# Patient Record
Sex: Female | Born: 1960 | Race: White | Hispanic: No | Marital: Single | Smoking: Never smoker
Health system: Southern US, Community
[De-identification: ages and names within clinical notes are randomized; demographics above are authoritative.]

## PROBLEM LIST (undated history)

## (undated) DIAGNOSIS — F32A Depression, unspecified: Secondary | ICD-10-CM

## (undated) DIAGNOSIS — M1990 Unspecified osteoarthritis, unspecified site: Secondary | ICD-10-CM

## (undated) DIAGNOSIS — M8580 Other specified disorders of bone density and structure, unspecified site: Secondary | ICD-10-CM

## (undated) DIAGNOSIS — E559 Vitamin D deficiency, unspecified: Secondary | ICD-10-CM

## (undated) DIAGNOSIS — A64 Unspecified sexually transmitted disease: Secondary | ICD-10-CM

## (undated) DIAGNOSIS — F329 Major depressive disorder, single episode, unspecified: Secondary | ICD-10-CM

## (undated) DIAGNOSIS — Z8742 Personal history of other diseases of the female genital tract: Secondary | ICD-10-CM

## (undated) DIAGNOSIS — E079 Disorder of thyroid, unspecified: Secondary | ICD-10-CM

## (undated) DIAGNOSIS — D219 Benign neoplasm of connective and other soft tissue, unspecified: Secondary | ICD-10-CM

## (undated) DIAGNOSIS — D649 Anemia, unspecified: Secondary | ICD-10-CM

## (undated) HISTORY — DX: Unspecified sexually transmitted disease: A64

## (undated) HISTORY — DX: Depression, unspecified: F32.A

## (undated) HISTORY — DX: Other specified disorders of bone density and structure, unspecified site: M85.80

## (undated) HISTORY — DX: Personal history of other diseases of the female genital tract: Z87.42

## (undated) HISTORY — DX: Major depressive disorder, single episode, unspecified: F32.9

## (undated) HISTORY — PX: BREAST SURGERY: SHX581

## (undated) HISTORY — DX: Disorder of thyroid, unspecified: E07.9

## (undated) HISTORY — DX: Unspecified osteoarthritis, unspecified site: M19.90

## (undated) HISTORY — PX: AUGMENTATION MAMMAPLASTY: SUR837

## (undated) HISTORY — DX: Vitamin D deficiency, unspecified: E55.9

## (undated) HISTORY — DX: Anemia, unspecified: D64.9

## (undated) HISTORY — DX: Benign neoplasm of connective and other soft tissue, unspecified: D21.9

---

## 2013-04-06 ENCOUNTER — Encounter: Payer: Self-pay | Admitting: Internal Medicine

## 2013-04-06 ENCOUNTER — Ambulatory Visit (INDEPENDENT_AMBULATORY_CARE_PROVIDER_SITE_OTHER): Payer: BC Managed Care – PPO | Admitting: Internal Medicine

## 2013-04-06 VITALS — BP 102/68 | HR 64 | Temp 98.3°F | Resp 10 | Ht 64.0 in | Wt 140.0 lb

## 2013-04-06 DIAGNOSIS — E063 Autoimmune thyroiditis: Secondary | ICD-10-CM

## 2013-04-06 DIAGNOSIS — F32A Depression, unspecified: Secondary | ICD-10-CM

## 2013-04-06 DIAGNOSIS — F3289 Other specified depressive episodes: Secondary | ICD-10-CM

## 2013-04-06 DIAGNOSIS — F329 Major depressive disorder, single episode, unspecified: Secondary | ICD-10-CM

## 2013-04-06 DIAGNOSIS — Z8379 Family history of other diseases of the digestive system: Secondary | ICD-10-CM | POA: Insufficient documentation

## 2013-04-06 DIAGNOSIS — L80 Vitiligo: Secondary | ICD-10-CM

## 2013-04-06 LAB — CBC
HCT: 35.9 % — ABNORMAL LOW (ref 36.0–46.0)
RBC: 3.81 Mil/uL — ABNORMAL LOW (ref 3.87–5.11)
RDW: 12.7 % (ref 11.5–14.6)
WBC: 5.1 10*3/uL (ref 4.5–10.5)

## 2013-04-06 MED ORDER — SERTRALINE HCL 50 MG PO TABS: 50.0000 mg | ORAL_TABLET | Freq: Every day | ORAL | Status: DC

## 2013-04-06 MED ORDER — BUPROPION HCL ER (XL) 300 MG PO TB24: 300.0000 mg | ORAL_TABLET | Freq: Every day | ORAL | Status: DC

## 2013-04-06 NOTE — Progress Notes (Signed)
Subjective:     Patient ID: Lori Carney, female   DOB: 1961-07-24, 52 y.o.   MRN: [id number]  HPI Lori Carney is a 52 y/o woman, self-referred for management of Hashimoto hypothyroidism. She had a PCP in [place], but does not have a PCP since she moved to [place].  She was dx with hypothyroidism in 1989, right after giving birth. She was started on thyroid hh. She then developed depression and started medications, now better. She did not have labs checked in 7 years. At the last check, her dose of Synthroid (brand name) was not changed from 175 mcg. In fact, her dose has not been changed for a long time.  She takes Synthroid in am with ice tea usually, but she has not been consistent with taking it, and can take it with meals, or later in the day. She is not taking multivitamins, calcium or iron. She denies symptoms of hypothyroidism, except weight gain (15 pounds in the last 5 years.  She has no FH of thyroid ds. Lori Carney and Lori Carney have celiac ds.; she has vitiligo dx 1970, now extended to large portions of her body.  No other medical problems, has OA. She has depression, which is much better on her current medicines, and she has been on the same doses for 15 years. She also is menopausal for the last 1-1/2 years. She initially had increased anxiety, relieved by HRT, which he took for a year. She stopped taking this, and she did not have her return anxiety episodes. Last visit with OB/GYN was in September of last year.  Review of Systems Constitutional: + weight gain = 15 lbs in last 5 years, no fatigue, has hot flushes Eyes: no blurry vision, + xerophthalmia ENT: no sore throat, no nodules palpated in throat, no dysphagia/odynophagia, no hoarseness Cardiovascular: no CP/SOB/palpitations/leg swelling Respiratory: no cough/SOB Gastrointestinal: no N/V/D/C Musculoskeletal: no muscle/+ joint aches (hands mostly and shoulders)  Skin: + rashes (vitiligo - extended lately) Neurological:  no tremors/numbness/tingling/dizziness Psychiatric: + depression - but much improved with meds - she added Effexor to stop weight gain with Zoloft/no anxiety anymore - due to menopause (was on E+Pg for 1 year).   Past Medical History  Diagnosis Date  . Thyroid disease     hypothyroidism  . Depression    History reviewed. No pertinent past surgical history.  History   Social History  . Marital Status: Single    Number of Children: 42: 42 and 35 year old; has one special needs child   Occupational History  . Not working now, but she would start a new job tomorrow   Social History Main Topics  . Smoking status: Never Smoker   . Smokeless tobacco: Never Used  . Alcohol Use: Yes  . Drug Use: No  . Sexually Active: No   Social History Narrative   Regular exercise: seldom   Caffeine use: tea daily   Meds: buPROPion (WELLBUTRIN XL) 300 MG 24 hr tablet  levothyroxine (SYNTHROID, LEVOTHROID) 175 MCG tablet  sertraline (ZOLOFT) 50 MG tablet  Allergies not on file She was not sure, will let us know through MyChart after she looks in her file at home.  Family History  Problem Relation Age of Onset  . Arthritis Father    Objective:   Physical Exam BP 102/68  Pulse 64  Temp(Src) 98.3 F (36.8 C) (Oral)  Resp 10  Ht 5\' 4"  (1.626 m)  Wt 140 lb (63.504 kg)  BMI 24.02 kg/m2  SpO2 97% Wt Readings  from Last 3 Encounters:  04/06/13 140 lb (63.504 kg)  Constitutional: normal weight, in NAD, appears very tanned, with vitiligo portions of the skin Eyes: PERRLA, EOMI, no exophthalmos ENT: moist mucous membranes, no thyromegaly, no cervical lymphadenopathy Cardiovascular: RRR, No MRG Respiratory: CTA B Gastrointestinal: abdomen soft, NT, ND, BS+ Musculoskeletal: no deformities, strength intact in all 4 Skin: moist, warm, vitiligo and extensive portions of her body Neurological: no tremor with outstretched hands, DTR normal in all 4  Assessment:     1. Hashimoto's Hypothyroidism      Plan:     1. Hashimoto's hypothyroidism We discussed about the fact that taking Synthroid on an empty stomach has more predictable absorption pattern and better stability of her thyroid tests, so I recommended that she start doing this consistently. She was recommended to start calcium and vitamin D per by her OB/GYN, and I advised her to add this at night. - I will check thyroid tests today - when results back I would need to call in Synthroid refills  2. Vitiligo - she has 2 autoimmune diseases (see problem #1 and 2) and we discussed the discounters her a higher risk to develop a third. She has extensive family history of celiac disease, so we'll check a vitamin D today. - I will also check a CMP and CBC, see she has not have labs in a long time  I advised her to join my chart notes in her the results of the labs through there.  I will refer her to internal medicine to establish care with her PCP. Until then, I will refill her depression medications.    Office Visit on 04/06/2013  Component Date Value Range Status  . TSH 04/06/2013 0.10* 0.35 - 5.50 uIU/mL Final  . Free T4 04/06/2013 0.95  0.60 - 1.60 ng/dL Final  . T3, Free 82/95/6213 2.4  2.3 - 4.2 pg/mL Final  . Sodium 04/06/2013 139  135 - 145 mEq/L Final  . Potassium 04/06/2013 4.8  3.5 - 5.1 mEq/L Final  . Chloride 04/06/2013 107  96 - 112 mEq/L Final  . CO2 04/06/2013 26  19 - 32 mEq/L Final  . Glucose, Bld 04/06/2013 92  70 - 99 mg/dL Final  . BUN 08/65/7846 20  6 - 23 mg/dL Final  . Creatinine, Ser 04/06/2013 0.7  0.4 - 1.2 mg/dL Final  . Total Bilirubin 04/06/2013 1.0  0.3 - 1.2 mg/dL Final  . Alkaline Phosphatase 04/06/2013 34* 39 - 117 U/L Final  . AST 04/06/2013 19  0 - 37 U/L Final  . ALT 04/06/2013 18  0 - 35 U/L Final  . Total Protein 04/06/2013 6.9  6.0 - 8.3 g/dL Final  . Albumin 96/29/5284 4.2  3.5 - 5.2 g/dL Final  . Calcium 13/24/4010 9.4  8.4 - 10.5 mg/dL Final  . GFR 27/25/3664 94.35  >60.00 mL/min  Final  . Vit D, 25-Hydroxy 04/06/2013 48  30 - 89 ng/mL Final   Comment: This assay accurately quantifies Vitamin D, which is the sum of the                          25-Hydroxy forms of Vitamin D2 and D3.  Studies have shown that the                          optimum concentration of 25-Hydroxy Vitamin D is 30 ng/mL or higher.  Concentrations of Vitamin D between 20 and 29 ng/mL are considered to                          be insufficient and concentrations less than 20 ng/mL are considered                          to be deficient for Vitamin D.  . WBC 04/06/2013 5.1  4.5 - 10.5 K/uL Final  . RBC 04/06/2013 3.81* 3.87 - 5.11 Mil/uL Final  . Platelets 04/06/2013 264.0  150.0 - 400.0 K/uL Final  . Hemoglobin 04/06/2013 12.2  12.0 - 15.0 g/dL Final  . HCT 78/29/5621 35.9* 36.0 - 46.0 % Final  . MCV 04/06/2013 94.4  78.0 - 100.0 fl Final  . MCHC 04/06/2013 34.0  30.0 - 36.0 g/dL Final  . RDW 30/86/5784 12.7  11.5 - 14.6 % Final   TSH suppressed, will decrease Synthroid to 150 mcg daily - return for labs in 6-8 weeks. Msg sent.

## 2013-04-06 NOTE — Patient Instructions (Signed)
Please return in 6 months. Please join MyChart. I will release the results of your labs through MyChart.

## 2013-04-07 LAB — COMPREHENSIVE METABOLIC PANEL WITH GFR
ALT: 18 U/L (ref 0–35)
AST: 19 U/L (ref 0–37)
Albumin: 4.2 g/dL (ref 3.5–5.2)
Alkaline Phosphatase: 34 U/L — ABNORMAL LOW (ref 39–117)
BUN: 20 mg/dL (ref 6–23)
CO2: 26 meq/L (ref 19–32)
Calcium: 9.4 mg/dL (ref 8.4–10.5)
Chloride: 107 meq/L (ref 96–112)
Creatinine, Ser: 0.7 mg/dL (ref 0.4–1.2)
GFR: 94.35 mL/min
Glucose, Bld: 92 mg/dL (ref 70–99)
Potassium: 4.8 meq/L (ref 3.5–5.1)
Sodium: 139 meq/L (ref 135–145)
Total Bilirubin: 1 mg/dL (ref 0.3–1.2)
Total Protein: 6.9 g/dL (ref 6.0–8.3)

## 2013-04-07 LAB — T4, FREE: Free T4: 0.95 ng/dL (ref 0.60–1.60)

## 2013-04-07 LAB — VITAMIN D 25 HYDROXY (VIT D DEFICIENCY, FRACTURES): Vit D, 25-Hydroxy: 48 ng/mL (ref 30–89)

## 2013-04-08 ENCOUNTER — Encounter: Payer: Self-pay | Admitting: Internal Medicine

## 2013-04-08 MED ORDER — LEVOTHYROXINE SODIUM 150 MCG PO TABS: 150.0000 ug | ORAL_TABLET | Freq: Every day | ORAL | Status: DC

## 2013-10-07 ENCOUNTER — Other Ambulatory Visit: Payer: Self-pay

## 2013-10-13 ENCOUNTER — Encounter: Payer: Self-pay | Admitting: Podiatry

## 2013-10-13 ENCOUNTER — Ambulatory Visit (INDEPENDENT_AMBULATORY_CARE_PROVIDER_SITE_OTHER): Payer: BC Managed Care – PPO | Admitting: Podiatry

## 2013-10-13 VITALS — BP 141/90 | HR 70 | Resp 12

## 2013-10-13 DIAGNOSIS — M722 Plantar fascial fibromatosis: Secondary | ICD-10-CM

## 2013-10-14 NOTE — Progress Notes (Signed)
Subjective:     Patient ID: Lori Carney, female   DOB: [birth year], 52 y.o.   MRN: [id number]  HPI patient left before I could see her   Review of Systems     Objective:   Physical Exam     Assessment:    plantar fasciitis    Plan:     Call the patient to reschedule

## 2013-10-20 ENCOUNTER — Other Ambulatory Visit: Payer: Self-pay | Admitting: Internal Medicine

## 2013-11-05 ENCOUNTER — Emergency Department (HOSPITAL_COMMUNITY)
Admission: EM | Admit: 2013-11-05 | Discharge: 2013-11-05 | Disposition: A | Discharge status: Home / Self Care | Payer: BC Managed Care – PPO | Source: Home / Self Care | Attending: Family Medicine | Admitting: Family Medicine

## 2013-11-05 ENCOUNTER — Encounter (HOSPITAL_COMMUNITY): Payer: Self-pay | Admitting: Emergency Medicine

## 2013-11-05 DIAGNOSIS — N390 Urinary tract infection, site not specified: Secondary | ICD-10-CM

## 2013-11-05 LAB — POCT URINALYSIS DIP (DEVICE)
Bilirubin Urine: NEGATIVE
Ketones, ur: NEGATIVE mg/dL

## 2013-11-05 MED ORDER — CEPHALEXIN 500 MG PO CAPS: 500.0000 mg | ORAL_CAPSULE | Freq: Four times a day (QID) | ORAL | Status: DC

## 2013-11-05 NOTE — ED Provider Notes (Signed)
CSN: [id number]     Arrival date & time 11/05/13  0809 History   First MD Initiated Contact with Patient 11/05/13 0825     Chief Complaint  Patient presents with  . Urinary Tract Infection   (Consider location/radiation/quality/duration/timing/severity/associated sxs/prior Treatment) Patient is a 52 y.o. female presenting with urinary tract infection. The history is provided by the patient.  Urinary Tract Infection This is a new problem. The current episode started more than 1 week ago. The problem has not changed since onset.Pertinent negatives include no chest pain and no abdominal pain.    Past Medical History  Diagnosis Date  . Thyroid disease     hypothyroidism  . Depression    History reviewed. No pertinent past surgical history. Family History  Problem Relation Age of Onset  . Arthritis Father    History  Substance Use Topics  . Smoking status: Never Smoker   . Smokeless tobacco: Never Used  . Alcohol Use: Yes   OB History   Grav Para Term Preterm Abortions TAB SAB Ect Mult Living                 Review of Systems  Constitutional: Negative.  Negative for fever and chills.  Cardiovascular: Negative for chest pain.  Gastrointestinal: Negative.  Negative for abdominal pain.  Genitourinary: Positive for dysuria, urgency and frequency. Negative for hematuria, menstrual problem and pelvic pain.    Allergies  Review of patient's allergies indicates no known allergies.  Home Medications   Current Outpatient Rx  Name  Route  Sig  Dispense  Refill  . buPROPion (WELLBUTRIN XL) 300 MG 24 hr tablet   Oral   Take 1 tablet (300 mg total) by mouth daily.   90 tablet   1   . cephALEXin (KEFLEX) 500 MG capsule   Oral   Take 1 capsule (500 mg total) by mouth 4 (four) times daily. Take all of medicine and drink lots of fluids   20 capsule   0   . levothyroxine (SYNTHROID) 150 MCG tablet   Oral   Take 1 tablet (150 mcg total) by mouth daily before breakfast.   60  tablet   1   . meloxicam (MOBIC) 15 MG tablet               . sertraline (ZOLOFT) 50 MG tablet   Oral   Take 1 tablet (50 mg total) by mouth daily. 1 and 1/2 daily   140 tablet   1    BP 109/58  Pulse 77  Temp(Src) 97.7 F (36.5 C) (Oral)  Resp 16  SpO2 97% Physical Exam  Nursing note and vitals reviewed. Constitutional: She is oriented to person, place, and time. She appears well-developed and well-nourished.  Neck: Normal range of motion. Neck supple.  Abdominal: Soft. Bowel sounds are normal.  Lymphadenopathy:    She has no cervical adenopathy.  Neurological: She is alert and oriented to person, place, and time.  Skin: Skin is warm and dry.    ED Course  Procedures (including critical care time) Labs Review Labs Reviewed  POCT URINALYSIS DIP (DEVICE) - Abnormal; Notable for the following:    Hgb urine dipstick LARGE (*)    Nitrite POSITIVE (*)    Leukocytes, UA SMALL (*)    All other components within normal limits   Imaging Review No results found.  EKG Interpretation    Date/Time:    Ventricular Rate:    PR Interval:    QRS Duration:  QT Interval:    QTC Calculation:   R Axis:     Text Interpretation:              MDM      Linna Hoff, MD 11/05/13 [phone]

## 2013-11-05 NOTE — ED Notes (Signed)
C/O UTI SYMPTOMS FOR A MONTH.  C/O BURNING WITH URINATION, NO ABDOMINAL PAIN, NO BACK PAIN.

## 2013-11-07 ENCOUNTER — Other Ambulatory Visit: Payer: Self-pay | Admitting: Internal Medicine

## 2013-11-08 NOTE — Telephone Encounter (Signed)
Needs to have further refills through PCP!

## 2013-11-17 ENCOUNTER — Emergency Department (HOSPITAL_COMMUNITY)
Admission: EM | Admit: 2013-11-17 | Discharge: 2013-11-17 | Disposition: A | Discharge status: Home / Self Care | Payer: BC Managed Care – PPO | Source: Home / Self Care | Attending: Family Medicine | Admitting: Family Medicine

## 2013-11-17 ENCOUNTER — Encounter (HOSPITAL_COMMUNITY): Payer: Self-pay | Admitting: Emergency Medicine

## 2013-11-17 DIAGNOSIS — J069 Acute upper respiratory infection, unspecified: Secondary | ICD-10-CM

## 2013-11-17 DIAGNOSIS — R319 Hematuria, unspecified: Secondary | ICD-10-CM

## 2013-11-17 LAB — POCT RAPID STREP A: Streptococcus, Group A Screen (Direct): NEGATIVE

## 2013-11-17 LAB — POCT URINALYSIS DIP (DEVICE)
Leukocytes, UA: NEGATIVE
Nitrite: NEGATIVE
Protein, ur: NEGATIVE mg/dL
Urobilinogen, UA: 0.2 mg/dL (ref 0.0–1.0)
pH: 6.5 (ref 5.0–8.0)

## 2013-11-17 LAB — GLUCOSE, CAPILLARY: Glucose-Capillary: 111 mg/dL — ABNORMAL HIGH (ref 70–99)

## 2013-11-17 MED ORDER — ALBUTEROL SULFATE HFA 108 (90 BASE) MCG/ACT IN AERS: 1.0000 | INHALATION_SPRAY | Freq: Four times a day (QID) | RESPIRATORY_TRACT | Status: DC | PRN

## 2013-11-17 MED ORDER — BENZONATATE 100 MG PO CAPS: 100.0000 mg | ORAL_CAPSULE | Freq: Three times a day (TID) | ORAL | Status: DC | PRN

## 2013-11-17 NOTE — ED Notes (Signed)
Follow up on a bladder infection States she does not feel as if infection is clear States she stills urinary frequency and still has a little pain  C/o cold sx for four days now States she has sneezing, wheezing, congestion, cough and sore throat mucinex and Advil was used as treatment but no relief.

## 2013-11-17 NOTE — ED Provider Notes (Signed)
CSN: [id number]     Arrival date & time 11/17/13  0801 History   First MD Initiated Contact with Patient 11/17/13 [phone]     Chief Complaint  Patient presents with  . URI  . Cystitis   (Consider location/radiation/quality/duration/timing/severity/associated sxs/prior Treatment) Patient is a 52 y.o. female presenting with URI. The history is provided by the patient.  URI Presenting symptoms: congestion, cough, fatigue, rhinorrhea and sore throat   Presenting symptoms: no ear pain and no fever   Severity:  Moderate Onset quality:  Gradual Duration:  4 days Progression:  Waxing and waning Chronicity:  New Relieved by:  Nothing Ineffective treatments:  OTC medications Associated symptoms: sneezing   Associated symptoms: no arthralgias, no headaches, no myalgias, no neck pain, no sinus pain, no swollen glands and no wheezing     Past Medical History  Diagnosis Date  . Thyroid disease     hypothyroidism  . Depression    History reviewed. No pertinent past surgical history. Family History  Problem Relation Age of Onset  . Arthritis Father    History  Substance Use Topics  . Smoking status: Never Smoker   . Smokeless tobacco: Never Used  . Alcohol Use: Yes   OB History   Grav Para Term Preterm Abortions TAB SAB Ect Mult Living                 Review of Systems  Constitutional: Positive for fatigue. Negative for fever and chills.  HENT: Positive for congestion, rhinorrhea, sneezing and sore throat. Negative for ear pain.   Eyes: Negative.   Respiratory: Positive for cough. Negative for shortness of breath and wheezing.   Cardiovascular: Negative.   Gastrointestinal: Negative.   Endocrine: Negative for polydipsia and polyphagia.  Genitourinary: Positive for frequency. Negative for dysuria, urgency, hematuria, flank pain, vaginal bleeding, vaginal discharge, enuresis, vaginal pain, menstrual problem and pelvic pain.  Musculoskeletal: Negative for arthralgias, myalgias and  neck pain.  Skin: Negative.   Allergic/Immunologic: Negative for immunocompromised state.  Neurological: Negative for headaches.  Hematological: Negative for adenopathy.  Psychiatric/Behavioral: Negative.     Allergies  Macrobid  Home Medications   Current Outpatient Rx  Name  Route  Sig  Dispense  Refill  . buPROPion (WELLBUTRIN XL) 300 MG 24 hr tablet      TAKE 1 TABLET BY MOUTH DAILY   90 tablet   0   . cephALEXin (KEFLEX) 500 MG capsule   Oral   Take 1 capsule (500 mg total) by mouth 4 (four) times daily. Take all of medicine and drink lots of fluids   20 capsule   0   . levothyroxine (SYNTHROID) 150 MCG tablet   Oral   Take 1 tablet (150 mcg total) by mouth daily before breakfast.   60 tablet   1   . meloxicam (MOBIC) 15 MG tablet               . sertraline (ZOLOFT) 50 MG tablet   Oral   Take 1 tablet (50 mg total) by mouth daily. 1 and 1/2 daily   140 tablet   1    BP 129/57  Pulse 84  Temp(Src) 97.9 F (36.6 C) (Oral)  Resp 16  SpO2 100% Physical Exam  Nursing note and vitals reviewed. Constitutional: She is oriented to person, place, and time. She appears well-developed and well-nourished. No distress.  HENT:  Head: Normocephalic and atraumatic.  Right Ear: Hearing, tympanic membrane, external ear and ear canal normal.  Left Ear: Hearing, tympanic membrane, external ear and ear canal normal.  Nose: Nose normal.  Mouth/Throat: Uvula is midline, oropharynx is clear and moist and mucous membranes are normal.  Eyes: Conjunctivae are normal. Pupils are equal, round, and reactive to light. Right eye exhibits no discharge. Left eye exhibits no discharge. No scleral icterus.  Neck: Normal range of motion. Neck supple.  Cardiovascular: Normal rate, regular rhythm and normal heart sounds.   Pulmonary/Chest: Effort normal and breath sounds normal.  Abdominal: Soft. Bowel sounds are normal. There is no tenderness.  Musculoskeletal: Normal range of motion.   Lymphadenopathy:    She has no cervical adenopathy.  Neurological: She is alert and oriented to person, place, and time.  Skin: Skin is warm and dry. No rash noted.  Psychiatric: She has a normal mood and affect. Her behavior is normal.    ED Course  Procedures (including critical care time) Labs Review Labs Reviewed - No data to display Imaging Review No results found.  EKG Interpretation    Date/Time:    Ventricular Rate:    PR Interval:    QRS Duration:   QT Interval:    QTC Calculation:   R Axis:     Text Interpretation:              MDM  CBG (testing in response to complaint or persistent urinary frequency) near normal at 111. UA with no further signs of infection, but with persistent hematuria, suggesting the need for either repeat testing in 2 weeks with referral to urology if hematuria persists or if patient wishes to follow up with urology after today's visit, will provide referral info to patient on discharge paperwork. (Referred to Dr. Gildardo Griffes Diarmid). Rapid strep negative and exam consistent with viral URI. Will advise symptomatic care at home.     Jess Barters [place], [place] 11/17/13 [phone]

## 2013-11-18 NOTE — ED Provider Notes (Signed)
Medical screening examination/treatment/procedure(s) were performed by a resident physician or non-physician practitioner and as the supervising physician I was immediately available for consultation/collaboration.  Bobbye Reinitz, MD   Bensyn Bornemann S Dawt Reeb, MD 11/18/13 0824 

## 2013-11-19 LAB — CULTURE, GROUP A STREP

## 2013-11-20 ENCOUNTER — Telehealth (HOSPITAL_COMMUNITY): Payer: Self-pay | Admitting: Family Medicine

## 2013-11-20 MED ORDER — AMOXICILLIN 500 MG PO CAPS: 1000.0000 mg | ORAL_CAPSULE | Freq: Two times a day (BID) | ORAL | Status: DC

## 2013-11-20 NOTE — ED Notes (Signed)
Throat culture positive for group A strep.  [facility] called in.  I contacted the patient.   Rodolph Bong, MD 11/20/13 [phone]

## 2013-12-08 ENCOUNTER — Ambulatory Visit (INDEPENDENT_AMBULATORY_CARE_PROVIDER_SITE_OTHER): Payer: BC Managed Care – PPO | Admitting: Obstetrics and Gynecology

## 2013-12-08 ENCOUNTER — Encounter: Payer: Self-pay | Admitting: Obstetrics and Gynecology

## 2013-12-08 VITALS — BP 100/68 | HR 70 | Resp 16 | Ht 64.0 in | Wt 135.5 lb

## 2013-12-08 DIAGNOSIS — Z23 Encounter for immunization: Secondary | ICD-10-CM

## 2013-12-08 DIAGNOSIS — Z0000 Encounter for general adult medical examination without abnormal findings: Secondary | ICD-10-CM

## 2013-12-08 DIAGNOSIS — Z01419 Encounter for gynecological examination (general) (routine) without abnormal findings: Secondary | ICD-10-CM

## 2013-12-08 DIAGNOSIS — R319 Hematuria, unspecified: Secondary | ICD-10-CM

## 2013-12-08 DIAGNOSIS — Z1239 Encounter for other screening for malignant neoplasm of breast: Secondary | ICD-10-CM

## 2013-12-08 LAB — POCT URINALYSIS DIPSTICK
Bilirubin, UA: NEGATIVE
Glucose, UA: NEGATIVE
Ketones, UA: NEGATIVE
Leukocytes, UA: NEGATIVE
NITRITE UA: NEGATIVE
PH UA: 5
Protein, UA: NEGATIVE
Urobilinogen, UA: NEGATIVE

## 2013-12-08 LAB — COMPREHENSIVE METABOLIC PANEL
ALBUMIN: 4.3 g/dL (ref 3.5–5.2)
ALT: 18 U/L (ref 0–35)
AST: 17 U/L (ref 0–37)
Alkaline Phosphatase: 44 U/L (ref 39–117)
BUN: 19 mg/dL (ref 6–23)
CALCIUM: 9.8 mg/dL (ref 8.4–10.5)
CO2: 31 meq/L (ref 19–32)
Chloride: 102 mEq/L (ref 96–112)
Creat: 0.77 mg/dL (ref 0.50–1.10)
Glucose, Bld: 100 mg/dL — ABNORMAL HIGH (ref 70–99)
POTASSIUM: 4.7 meq/L (ref 3.5–5.3)
SODIUM: 138 meq/L (ref 135–145)
TOTAL PROTEIN: 6.8 g/dL (ref 6.0–8.3)
Total Bilirubin: 0.7 mg/dL (ref 0.3–1.2)

## 2013-12-08 LAB — CBC
HCT: 34.6 % — ABNORMAL LOW (ref 36.0–46.0)
Hemoglobin: 11.5 g/dL — ABNORMAL LOW (ref 12.0–15.0)
MCH: 30.4 pg (ref 26.0–34.0)
MCHC: 33.2 g/dL (ref 30.0–36.0)
MCV: 91.5 fL (ref 78.0–100.0)
PLATELETS: 357 10*3/uL (ref 150–400)
RBC: 3.78 MIL/uL — ABNORMAL LOW (ref 3.87–5.11)
RDW: 13.3 % (ref 11.5–15.5)
WBC: 4.8 10*3/uL (ref 4.0–10.5)

## 2013-12-08 LAB — LIPID PANEL
Cholesterol: 223 mg/dL — ABNORMAL HIGH (ref 0–200)
HDL: 104 mg/dL (ref >39)
LDL Cholesterol: 104 mg/dL — ABNORMAL HIGH (ref 0–99)
Total CHOL/HDL Ratio: 2.1 Ratio
Triglycerides: 77 mg/dL (ref <150)
VLDL: 15 mg/dL (ref 0–40)

## 2013-12-08 LAB — HEMOGLOBIN, FINGERSTICK: Hemoglobin, fingerstick: 11.5 g/dL — ABNORMAL LOW (ref 12.0–16.0)

## 2013-12-08 NOTE — Patient Instructions (Addendum)
EXERCISE AND DIET:  We recommended that you start or continue a regular exercise program for good health. Regular exercise means any activity that makes your heart beat faster and makes you sweat.  We recommend exercising at least 30 minutes per day at least 3 days a week, preferably 4 or 5.  We also recommend a diet low in fat and sugar.  Inactivity, poor dietary choices and obesity can cause diabetes, heart attack, stroke, and kidney damage, among others.    ALCOHOL AND SMOKING:  Women should limit their alcohol intake to no more than 7 drinks/beers/glasses of wine (combined, not each!) per week. Moderation of alcohol intake to this level decreases your risk of breast cancer and liver damage. And of course, no recreational drugs are part of a healthy lifestyle.  And absolutely no smoking or even second hand smoke. Most people know smoking can cause heart and lung diseases, but did you know it also contributes to weakening of your bones? Aging of your skin?  Yellowing of your teeth and nails?  CALCIUM AND VITAMIN D:  Adequate intake of calcium and Vitamin D are recommended.  The recommendations for exact amounts of these supplements seem to change often, but generally speaking 600 mg of calcium (either carbonate or citrate) and 800 units of Vitamin D per day seems prudent. Certain women may benefit from higher intake of Vitamin D.  If you are among these women, your doctor will have told you during your visit.    PAP SMEARS:  Pap smears, to check for cervical cancer or precancers,  have traditionally been done yearly, although recent scientific advances have shown that most women can have pap smears less often.  However, every woman still should have a physical exam from her gynecologist every year. It will include a breast check, inspection of the vulva and vagina to check for abnormal growths or skin changes, a visual exam of the cervix, and then an exam to evaluate the size and shape of the uterus and  ovaries.  And after 55 years of age, a rectal exam is indicated to check for rectal cancers. We will also provide age appropriate advice regarding health maintenance, like when you should have certain vaccines, screening for sexually transmitted diseases, bone density testing, colonoscopy, mammograms, etc.   MAMMOGRAMS:  All women over 40 years old should have a yearly mammogram. Many facilities now offer a "3D" mammogram, which may cost around $50 extra out of pocket. If possible,  we recommend you accept the option to have the 3D mammogram performed.  It both reduces the number of women who will be called back for extra views which then turn out to be normal, and it is better than the routine mammogram at detecting truly abnormal areas.    COLONOSCOPY:  Colonoscopy to screen for colon cancer is recommended for all women at age 50.  We know, you hate the idea of the prep.  We agree, BUT, having colon cancer and not knowing it is worse!!  Colon cancer so often starts as a polyp that can be seen and removed at colonscopy, which can quite literally save your life!  And if your first colonoscopy is normal and you have no family history of colon cancer, most women don't have to have it again for 10 years.  Once every ten years, you can do something that may end up saving your life, right?  We will be happy to help you get it scheduled when you are ready.    Be sure to check your insurance coverage so you understand how much it will cost.  It may be covered as a preventative service at no cost, but you should check your particular policy.     Tetanus, Diphtheria, Pertussis (Tdap) Vaccine What You Need to Know WHY GET VACCINATED? Tetanus, diphtheria and pertussis can be very serious diseases, even for adolescents and adults. Tdap vaccine can protect [place] from these diseases. TETANUS (Lockjaw) causes painful muscle tightening and stiffness, usually all over the body.  It can lead to tightening of muscles in the  head and neck so you can't open your mouth, swallow, or sometimes even breathe. Tetanus kills about 1 out of 5 people who are infected. DIPHTHERIA can cause a thick coating to form in the back of the throat.  It can lead to breathing problems, paralysis, heart failure, and death. PERTUSSIS (Whooping Cough) causes severe coughing spells, which can cause difficulty breathing, vomiting and disturbed sleep.  It can also lead to weight loss, incontinence, and rib fractures. Up to 2 in 100 adolescents and 5 in 100 adults with pertussis are hospitalized or have complications, which could include pneumonia and death. These diseases are caused by bacteria. Diphtheria and pertussis are spread from person to person through coughing or sneezing. Tetanus enters the body through cuts, scratches, or wounds. Before vaccines, the [place] saw as many as 200,000 cases a year of diphtheria and pertussis, and hundreds of cases of tetanus. Since vaccination began, tetanus and diphtheria have dropped by about 99% and pertussis by about 80%. TDAP VACCINE Tdap vaccine can protect adolescents and adults from tetanus, diphtheria, and pertussis. One dose of Tdap is routinely given at age 46 or 70. People who did not get Tdap at that age should get it as soon as possible. Tdap is especially important for health care professionals and anyone having close contact with a baby younger than 12 months. Pregnant women should get a dose of Tdap during every pregnancy, to protect the newborn from pertussis. Infants are most at risk for severe, life-threatening complications from pertussis. A similar vaccine, called Td, protects from tetanus and diphtheria, but not pertussis. A Td booster should be given every 10 years. Tdap may be given as one of these boosters if you have not already gotten a dose. Tdap may also be given after a severe cut or burn to prevent tetanus infection. Your doctor can give you more information. Tdap may  safely be given at the same time as other vaccines. SOME PEOPLE SHOULD NOT GET THIS VACCINE  If you ever had a life-threatening allergic reaction after a dose of any tetanus, diphtheria, or pertussis containing vaccine, OR if you have a severe allergy to any part of this vaccine, you should not get Tdap. Tell your doctor if you have any severe allergies.  If you had a coma, or long or multiple seizures within 7 days after a childhood dose of DTP or DTaP, you should not get Tdap, unless a cause other than the vaccine was found. You can still get Td.  Talk to your doctor if you:  have epilepsy or another nervous system problem,  had severe pain or swelling after any vaccine containing diphtheria, tetanus or pertussis,  ever had Guillain-Barr Syndrome (GBS),  aren't feeling well on the day the shot is scheduled. RISKS OF A VACCINE REACTION With any medicine, including vaccines, there is a chance of side effects. These are usually mild and go away on their own, but  serious reactions are also possible. Brief fainting spells can follow a vaccination, leading to injuries from falling. Sitting or lying down for about 15 minutes can help prevent these. Tell your doctor if you feel dizzy or light-headed, or have vision changes or ringing in the ears. Mild problems following Tdap (Did not interfere with activities)  Pain where the shot was given (about 3 in 4 adolescents or 2 in 3 adults)  Redness or swelling where the shot was given (about 1 person in 5)  Mild fever of at least 100.4F (up to about 1 in 25 adolescents or 1 in 100 adults)  Headache (about 3 or 4 people in 10)  Tiredness (about 1 person in 3 or 4)  Nausea, vomiting, diarrhea, stomach ache (up to 1 in 4 adolescents or 1 in 10 adults)  Chills, body aches, sore joints, rash, swollen glands (uncommon) Moderate problems following Tdap (Interfered with activities, but did not require medical attention)  Pain where the shot was  given (about 1 in 5 adolescents or 1 in 100 adults)  Redness or swelling where the shot was given (up to about 1 in 16 adolescents or 1 in 25 adults)  Fever over 102F (about 1 in 100 adolescents or 1 in 250 adults)  Headache (about 3 in 20 adolescents or 1 in 10 adults)  Nausea, vomiting, diarrhea, stomach ache (up to 1 or 3 people in 100)  Swelling of the entire arm where the shot was given (up to about 3 in 100). Severe problems following Tdap (Unable to perform usual activities, required medical attention)  Swelling, severe pain, bleeding and redness in the arm where the shot was given (rare). A severe allergic reaction could occur after any vaccine (estimated less than 1 in a million doses). WHAT IF THERE IS A SERIOUS REACTION? What should I look for?  Look for anything that concerns you, such as signs of a severe allergic reaction, very high fever, or behavior changes. Signs of a severe allergic reaction can include hives, swelling of the face and throat, difficulty breathing, a fast heartbeat, dizziness, and weakness. These would start a few minutes to a few hours after the vaccination. What should I do?  If you think it is a severe allergic reaction or other emergency that can't wait, call 9-1-1 or get the person to the nearest hospital. Otherwise, call your doctor.  Afterward, the reaction should be reported to the "Vaccine Adverse Event Reporting System" (VAERS). Your doctor might file this report, or you can do it yourself through the VAERS web site at www.vaers.hhs.gov, or by calling 1-800-822-7967. VAERS is only for reporting reactions. They do not give medical advice.  THE NATIONAL VACCINE INJURY COMPENSATION PROGRAM The National Vaccine Injury Compensation Program (VICP) is a federal program that was created to compensate people who may have been injured by certain vaccines. Persons who believe they may have been injured by a vaccine can learn about the program and about  filing a claim by calling 1-800-338-2382 or visiting the VICP website at www.hrsa.gov/vaccinecompensation. HOW CAN I LEARN MORE?  Ask your doctor.  Call your local or state health department.  Contact the Centers for Disease Control and Prevention (CDC):  Call 1-800-232-4636 or visit CDC's website at www.cdc.gov/vaccines. CDC Tdap Vaccine VIS (04/09/12) Document Released: 05/19/2012 Document Revised: 03/15/2013 Document Reviewed: 03/10/2013 ExitCare Patient Information 2014 [employer].  

## 2013-12-08 NOTE — Progress Notes (Signed)
Patient ID: Lori Carney, female   DOB: May 20, 1961, 53 y.o.   MRN: [id number]  GYNECOLOGY VISIT  PCP:   Needs a PCP Endocrinologist:  Dr. Cruzita Lederer Referring provider:   HPI: 53 y.o.   Single  Caucasian  female   No obstetric history on file. with Patient's last menstrual period was 12/03/2011.   here for an annual examination.   Mother and paternal grandmother with breast cancer.  Patient has breast implants.    Taking Wellbutrin and Zoloft for the last 25 years.    Has post coital UTIs.  Went to urgent care one month ago.  No burning now.  Has frequency.    Hgb:  11.5 Urine:  1+ - Saw a urologist 55 years ago.  Does not remember.   GYNECOLOGIC HISTORY: Patient's last menstrual period was 12/03/2011. Sexually active:  Not often Partner preference:  Contraception:   none Menopausal hormone therapy: none GYN Procedures:  Status post cryo and LEEP in 2006 or 2007.   Mammogram: December  2012 - BI-RADS 2  Pap:  09/20/2010 - No SIL, BV noted, endometrial cells noted.  Follow up ultrasound - 3 uterine fibroids, one subserosal and 2 intramural, largest 2.9 cm.  EMS 5 mm. Left ovary with 2.7 x 1.6 cm simple cyst.   Right ovary not seen.  No free fluid.  History of abnormal pap smear:  Yes - LEEP in 2006 or 2007.   OB History   Grav Para Term Preterm Abortions TAB SAB Ect Mult Living                   LIFESTYLE: Exercise:   none            Tobacco:  none Alcohol:  5 vodkas a week Drug use:    OTHER HEALTH MAINTENANCE: Tetanus/TDap:  Over 10 years. Gardisil:  NA  Zostavax:  NA  Bone density:   2013 - osteopenia.   T score of spine:  - 1.5, T score of hip: - 0.4 Colonoscopy:  8 years ago in [place] - normal  Cholesterol check:   Family History  Problem Relation Age of Onset  . Arthritis Father     Patient Active Problem List   Diagnosis Date Noted  . Hashimoto's disease 04/06/2013  . Vitiligo 04/06/2013  . FHx: celiac disease 04/06/2013   Past Medical History   Diagnosis Date  . Thyroid disease     hypothyroidism  . Depression     History reviewed. No pertinent past surgical history.  ALLERGIES: Macrobid  Current Outpatient Prescriptions  Medication Sig Dispense Refill  . albuterol (PROVENTIL HFA;VENTOLIN HFA) 108 (90 BASE) MCG/ACT inhaler Inhale 1-2 puffs into the lungs every 6 (six) hours as needed for wheezing or shortness of breath.  1 Inhaler  0  . amoxicillin (AMOXIL) 500 MG capsule Take 2 capsules (1,000 mg total) by mouth 2 (two) times daily.  28 capsule  0  . benzonatate (TESSALON) 100 MG capsule Take 1 capsule (100 mg total) by mouth 3 (three) times daily as needed for cough.  21 capsule  0  . buPROPion (WELLBUTRIN XL) 300 MG 24 hr tablet TAKE 1 TABLET BY MOUTH DAILY  90 tablet  0  . cephALEXin (KEFLEX) 500 MG capsule Take 1 capsule (500 mg total) by mouth 4 (four) times daily. Take all of medicine and drink lots of fluids  20 capsule  0  . levothyroxine (SYNTHROID) 150 MCG tablet Take 1 tablet (150 mcg total) by mouth daily  before breakfast.  60 tablet  1  . meloxicam (MOBIC) 15 MG tablet       . sertraline (ZOLOFT) 50 MG tablet Take 1 tablet (50 mg total) by mouth daily. 1 and 1/2 daily  140 tablet  1   No current facility-administered medications for this visit.     ROS:  Pertinent items are noted in HPI.  SOCIAL HISTORY:  Newly moved from [place].  Works for [employer].   PHYSICAL EXAMINATION:    BP 100/68  Pulse 70  Resp 16  Ht 5\' 4"  (1.626 m)  Wt 135 lb 8 oz (61.462 kg)  BMI 23.25 kg/m2  LMP 12/03/2011   Wt Readings from Last 3 Encounters:  12/08/13 135 lb 8 oz (61.462 kg)  04/06/13 140 lb (63.504 kg)     Ht Readings from Last 3 Encounters:  12/08/13 5\' 4"  (1.626 m)  04/06/13 5\' 4"  (1.626 m)    General appearance: alert, cooperative and appears stated age Head: Normocephalic, without obvious abnormality, atraumatic Neck: no adenopathy, supple, symmetrical, trachea midline and thyroid not enlarged,  symmetric, no tenderness/mass/nodules Lungs: clear to auscultation bilaterally Breasts: Inspection negative, No nipple retraction or dimpling, No nipple discharge or bleeding, No axillary or supraclavicular adenopathy, Normal to palpation without dominant masses Heart: regular rate and rhythm Abdomen: soft, non-tender; no masses,  no organomegaly Extremities: extremities normal, atraumatic, no cyanosis or edema Skin: Skin color, texture, turgor normal. No rashes or lesions.  Scattered area of hypopigmentation. Lymph nodes: Cervical, supraclavicular, and axillary nodes normal. No abnormal inguinal nodes palpated Neurologic: Grossly normal  Pelvic: External genitalia:  no lesions.  Hypopigmentation of the vulva              Urethra:  normal appearing urethra with no masses, tenderness or lesions              Bartholins and Skenes: normal                 Vagina: normal appearing vagina with normal color and discharge, no lesions              Cervix: normal appearance, consistent with LEEP              Pap and high risk HPV testing done: yes.            Bimanual Exam:  Uterus:  uterus is normal size, shape, consistency and nontender                                      Adnexa: normal adnexa in size, nontender and no masses                                      Rectovaginal: Confirms                                      Anus:  normal sphincter tone, no lesions  ASSESSMENT  Normal gynecologic exam. Microscopic hematuria.   Osteopenia. History of LEEP and cryotherapy to cervix Family history of breast cancer Has bilateral breast implants Hypothyroidism Vitiligo   PLAN  Mammogram - 3D at [facility] Center.  Patient will call. Pap smear and high risk HPV testing Counseled on breast self exam,  mammography screening, adequate intake of calcium and vitamin , weight bearing exercise.  CMP, CBC, lipid profile. TDap today. Urine micro and culture. Repeat bone density in 2015 or 2016.  Will  establish care with PCP.  Return annually or prn   An After Visit Summary was printed and given to the patient.

## 2013-12-09 LAB — URINALYSIS, MICROSCOPIC ONLY
BACTERIA UA: NONE SEEN
Casts: NONE SEEN
Crystals: NONE SEEN
Squamous Epithelial / LPF: NONE SEEN

## 2013-12-09 LAB — URINE CULTURE
Colony Count: NO GROWTH
ORGANISM ID, BACTERIA: NO GROWTH

## 2013-12-13 LAB — IPS PAP TEST WITH HPV

## 2013-12-14 NOTE — Addendum Note (Signed)
Addended by: Tacy Learn, Raymona Boss E on: 12/14/2013 12:38 PM   Modules accepted: Orders

## 2013-12-15 LAB — IPS HPV GENOTYPING 16/18

## 2013-12-20 ENCOUNTER — Telehealth: Payer: Self-pay | Admitting: Emergency Medicine

## 2013-12-20 NOTE — Telephone Encounter (Signed)
Message copied by Michele Mcalpine on Mon Dec 20, 2013  4:07 PM ------      Message from: [place], [place]: Mon Dec 13, 2013  9:37 AM       Please inform patient of normal pap but positive high risk HPV test result.      I will proceed with subtyping of HPV for 16 and 18.      If this is positive, patient will need a colposcopy according to protocols.      If it is negative, she can do a repap and repeat HPV testing in one year. ------

## 2013-12-20 NOTE — Telephone Encounter (Signed)
Most recent message from Dr. Quincy Simmonds:   Notes Recorded by Jamey Reas de Berton Lan, MD on 12/16/2013 at 1:19 PM Results to patient through My Chart. Please also contact her by phone to let her know that her test results were negative for HPV types 16 and 18. Place in recall - 08.  Recall placed.  Attempted to reach patient. Message from [place] states that this number is unable to accept incoming calls at this time.

## 2013-12-21 NOTE — Telephone Encounter (Signed)
Unable to complete call per message from [place] [employer].

## 2013-12-23 NOTE — Telephone Encounter (Signed)
Gay Filler has spoke with patient regarding results. Will close encounter.  Notes Recorded by Huey Romans, RN on 12/21/2013 at 5:43 PM Spoke to patient regarding negative pap smear and pos HPV results. Explained with NEGATIVE pap and negative 16/18, which is associated with majority of cervical cancers, recommendation is to repeat pap in one year. 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 Advised that this is generally not needed, is very expensive and she currently has a normal pap. If pap had any abnormality, additional testing might be indicated but this is not the case for her. She reports that she has had two other procedures as treatment of abnormal pap and wonders if hysterectomy would be an option to treat this.Confirmed that although hysterectomy would remove the cervix, this alone is not enough of a medical indication for hysterectomy and there is still potential for recurrence. Advised that repeating pap and HPV in one year allows enough time for there to be improvement and/or worsening of cervical cells without being too much time to allow advanced progression. Advised that she is welcome to come in for consult with MD if has additional questions or still desires additional testing. Declines for now, states she feels she has better understanding. Patient is not scheduled for AEX next year but 08 recall is entered for 12-20-14.

## 2013-12-31 ENCOUNTER — Ambulatory Visit
Admission: RE | Admit: 2013-12-31 | Discharge: 2013-12-31 | Disposition: A | Discharge status: Home / Self Care | Payer: Self-pay | Source: Ambulatory Visit | Attending: Obstetrics and Gynecology | Admitting: Obstetrics and Gynecology

## 2013-12-31 DIAGNOSIS — Z1239 Encounter for other screening for malignant neoplasm of breast: Secondary | ICD-10-CM

## 2014-01-12 ENCOUNTER — Telehealth: Payer: Self-pay | Admitting: Emergency Medicine

## 2014-01-12 NOTE — Telephone Encounter (Signed)
Patient calling with questions regarding Mammogram.  Unable to leave message.  "[employer] customer you are trying to reach is unavailable at this time." Will have to try again.

## 2014-01-14 NOTE — Telephone Encounter (Signed)
Spoke with patient. She has questions regarding the recall she received for her Mammogram from the breast center. Explained that her mammogram was a screening mammogram and the breast center radiologist is requesting additional images on the L breast for a more diagnostic test, so that the radiologist can look directly at the area they would like more images on. Patient will call for follow up. Offered to call to set up, patient declined.   Also, patient is looking for a primary care provider, she was calling to ensure that Dr. Phebe Colla was the provider that Dr Quincy Simmonds suggested. Advised that I did not see a referral placed, patient states she will call office to schedule and find an internal medicine provider.   Routing to provider for final review. Patient agreeable to disposition. Will close encounter

## 2014-01-14 NOTE — Telephone Encounter (Signed)
Lori Carney,  I see the Breast Center has just made a correction in the patient's mammogram and are now calling it normal.  I expect that they will need to contact the patient to make this clarification.  You may take her out of hold.  I did mention Dr. Renato Shin as an option for the patient, but I did not place a referral.

## 2014-01-17 NOTE — Telephone Encounter (Signed)
Patient out of hold.  Patient advised she would contact Dr. Genice Rouge office to schedule.

## 2014-02-06 ENCOUNTER — Other Ambulatory Visit: Payer: Self-pay | Admitting: Internal Medicine

## 2014-02-07 NOTE — Telephone Encounter (Signed)
Will FWD to PCP.

## 2014-02-22 NOTE — Telephone Encounter (Addendum)
I have not yet seen this patient  So cannot rx medication She is no my sched to be est in June . But we can move up appt to see her sooner  April  Not on a tues or Wednesday preferably

## 2014-03-29 ENCOUNTER — Other Ambulatory Visit: Payer: Self-pay | Admitting: Internal Medicine

## 2014-04-05 ENCOUNTER — Other Ambulatory Visit: Payer: Self-pay | Admitting: Obstetrics and Gynecology

## 2014-04-05 MED ORDER — BUPROPION HCL ER (XL) 300 MG PO TB24: ORAL_TABLET | ORAL | Status: DC

## 2014-04-05 NOTE — Telephone Encounter (Signed)
I will approve the Rx for one month.

## 2014-04-05 NOTE — Telephone Encounter (Signed)
Pt is wondering if dr Quincy Simmonds will refill her Rx for Buproprion xl 300 mg for 1 month. Says she can't get in with the Dr she referred her to for 5 months.

## 2014-04-05 NOTE — Telephone Encounter (Signed)
Spoke with patient. Patient states that Dr.Silva referred her to Dr.Panosh but she is not able to be seen until June 5th. Requesting that Dr.Silva refill Rx for Buproprion xl 300 mg for 1 month until she can go for appointment on June 5th. Advised would send a message to Dr.Silva with refill request and would give patient a call back. Patient agreeable.   Dr.Silva, okay to refill Rx for 1 month until patient can be seen by Dr.Panosh? Order pending.

## 2014-04-06 NOTE — Telephone Encounter (Signed)
Spoke with patient. Advised prescription sent to pharmacy of choice for one month. Patient agreeable and verbalizes understanding.  Routing to provider for final review. Patient agreeable to disposition. Will close encounter

## 2014-04-13 NOTE — Progress Notes (Signed)
Will need OV  - last seen 1 year ago. Will discuss then.

## 2014-04-29 ENCOUNTER — Other Ambulatory Visit: Payer: Self-pay | Admitting: Obstetrics and Gynecology

## 2014-04-29 MED ORDER — LEVOTHYROXINE SODIUM 150 MCG PO TABS: 150.0000 ug | ORAL_TABLET | Freq: Every day | ORAL | Status: DC

## 2014-04-29 MED ORDER — BUPROPION HCL ER (XL) 300 MG PO TB24: ORAL_TABLET | ORAL | Status: DC

## 2014-04-29 NOTE — Telephone Encounter (Signed)
05/09/14 patient seeing new PCP. She is requesting refills of generic Wellbutrin XL 300 MG and Synthroid .175 MG to last until that appointment. She had previously requested refills thinking her appointment was 05/06/14.  [place] [phone]

## 2014-04-29 NOTE — Telephone Encounter (Signed)
Pt is requesting additional tablets to last until appt with Dr. Regis Bill on 05/09/14.  Appt date verified in EPIC.   Please advise if refill is OK.

## 2014-05-04 ENCOUNTER — Other Ambulatory Visit: Payer: Self-pay | Admitting: Obstetrics and Gynecology

## 2014-05-05 ENCOUNTER — Telehealth: Payer: Self-pay | Admitting: Obstetrics and Gynecology

## 2014-05-05 MED ORDER — LEVOTHYROXINE SODIUM 175 MCG PO TABS: 175.0000 ug | ORAL_TABLET | Freq: Every day | ORAL | Status: DC

## 2014-05-05 NOTE — Telephone Encounter (Signed)
Spoke with patient. Patient states that she had rx called in by our office yesterday and when she went to pick it up the pills were a different color. Patient states that rx called in was 0.15mg  and she has been taking 0.175mg . Advised patient would placed order for Dr.Silva's review and signature to be sent to pharmacy of choice. Patient agreeable.  Dr.Silva, order placed for Synthroid 181mcg #30 with 7RF until AEX due in January. Please review and advise order. Thank you.

## 2014-05-05 NOTE — Telephone Encounter (Signed)
Patient says her prescription was called in with the wrong dosage yesterday. Please call ASAP.

## 2014-05-05 NOTE — Telephone Encounter (Signed)
Order placed on 5/29 for Synthroid 174mcg until patient could be seen by Dr.Panosh on 05/09/2014. Patient states she is to be taking 12mcg. Spoke with Suezanne Jacquet at [facility] who states that patient last filled rx for synthroid in March with a three month supply for 161mcg. Per Dr.Silva okay to refill rx for one month until patient can be seen by Dr.Panosh. Order placed.  Routing to provider for final review. Patient agreeable to disposition. Will close encounter

## 2014-05-05 NOTE — Telephone Encounter (Signed)
Lori Carney,  We have not been prescribing the patient's Synthroid for her. She did not call in the office yesterday for a refill that I can see. There is no documentation in Epic about this.  Did she call her endocrinologist or her PCP?  Thanks,  Josefa Half  [place]

## 2014-05-05 NOTE — Telephone Encounter (Signed)
Left message to call Kaitlyn at [phone]. 

## 2014-05-05 NOTE — Telephone Encounter (Signed)
Pharmacy calling re: Synthroid. Need to verify strength.

## 2014-05-09 ENCOUNTER — Ambulatory Visit (INDEPENDENT_AMBULATORY_CARE_PROVIDER_SITE_OTHER): Payer: BC Managed Care – PPO | Admitting: Internal Medicine

## 2014-05-09 ENCOUNTER — Encounter: Payer: Self-pay | Admitting: Internal Medicine

## 2014-05-09 VITALS — BP 118/80 | HR 67 | Temp 98.6°F | Ht 64.0 in | Wt 145.5 lb

## 2014-05-09 DIAGNOSIS — J309 Allergic rhinitis, unspecified: Secondary | ICD-10-CM

## 2014-05-09 DIAGNOSIS — E063 Autoimmune thyroiditis: Secondary | ICD-10-CM

## 2014-05-09 DIAGNOSIS — Z8379 Family history of other diseases of the digestive system: Secondary | ICD-10-CM

## 2014-05-09 DIAGNOSIS — M2550 Pain in unspecified joint: Secondary | ICD-10-CM

## 2014-05-09 DIAGNOSIS — L80 Vitiligo: Secondary | ICD-10-CM

## 2014-05-09 DIAGNOSIS — Z299 Encounter for prophylactic measures, unspecified: Secondary | ICD-10-CM

## 2014-05-09 LAB — LIPID PANEL
CHOL/HDL RATIO: 2
Cholesterol: 237 mg/dL — ABNORMAL HIGH (ref 0–200)
HDL: 118 mg/dL (ref >39.00)
LDL CALC: 102 mg/dL — AB (ref 0–99)
NONHDL: 119
TRIGLYCERIDES: 84 mg/dL (ref 0.0–149.0)
VLDL: 16.8 mg/dL (ref 0.0–40.0)

## 2014-05-09 LAB — CBC WITH DIFFERENTIAL/PLATELET
Basophils Absolute: 0 10*3/uL (ref 0.0–0.1)
Basophils Relative: 0.6 % (ref 0.0–3.0)
EOS ABS: 0.3 10*3/uL (ref 0.0–0.7)
Eosinophils Relative: 5.5 % — ABNORMAL HIGH (ref 0.0–5.0)
HCT: 34.3 % — ABNORMAL LOW (ref 36.0–46.0)
Hemoglobin: 11.3 g/dL — ABNORMAL LOW (ref 12.0–15.0)
Lymphocytes Relative: 37.5 % (ref 12.0–46.0)
Lymphs Abs: 2.1 10*3/uL (ref 0.7–4.0)
MCHC: 33 g/dL (ref 30.0–36.0)
MCV: 93.5 fl (ref 78.0–100.0)
MONO ABS: 0.4 10*3/uL (ref 0.1–1.0)
Monocytes Relative: 8 % (ref 3.0–12.0)
NEUTROS PCT: 48.4 % (ref 43.0–77.0)
Neutro Abs: 2.7 10*3/uL (ref 1.4–7.7)
PLATELETS: 276 10*3/uL (ref 150.0–400.0)
RBC: 3.67 Mil/uL — AB (ref 3.87–5.11)
RDW: 14.3 % (ref 11.5–15.5)
WBC: 5.6 10*3/uL (ref 4.0–10.5)

## 2014-05-09 LAB — BASIC METABOLIC PANEL
BUN: 17 mg/dL (ref 6–23)
CALCIUM: 9.8 mg/dL (ref 8.4–10.5)
CO2: 29 meq/L (ref 19–32)
CREATININE: 0.8 mg/dL (ref 0.4–1.2)
Chloride: 103 mEq/L (ref 96–112)
GFR: 82.79 mL/min (ref >60.00)
Glucose, Bld: 94 mg/dL (ref 70–99)
Potassium: 4.2 mEq/L (ref 3.5–5.1)
Sodium: 139 mEq/L (ref 135–145)

## 2014-05-09 LAB — HEPATIC FUNCTION PANEL
ALT: 20 U/L (ref 0–35)
AST: 18 U/L (ref 0–37)
Albumin: 4.1 g/dL (ref 3.5–5.2)
Alkaline Phosphatase: 42 U/L (ref 39–117)
BILIRUBIN DIRECT: 0 mg/dL (ref 0.0–0.3)
BILIRUBIN TOTAL: 0.6 mg/dL (ref 0.2–1.2)
Total Protein: 6.8 g/dL (ref 6.0–8.3)

## 2014-05-09 LAB — TSH: TSH: 0.14 u[IU]/mL — ABNORMAL LOW (ref 0.35–4.50)

## 2014-05-09 NOTE — Progress Notes (Signed)
Chief Complaint  Patient presents with  . Establish Care    Needs to establish with a PCP and needs someone to prescribe her Synthroid.    HPI: Patient comes in today for new patient visit .  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 She will be needing medication management her gynecologist has refilled her medicine in the short run. She had weaned herself off of her antidepressant medications a while back but had some relapsing so went back on and feels normal on these medications is interested in decreasing her medication count but not necessarily at this time. She takes meloxicam as needed for joint pains off and on it helps her when she plays golf. 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 Colonoscopy  08/04/2009  . Mammogram  12/03/2011  . Influenza Vaccine  07/02/2014  . Pap Smear  12/08/2016  . Tetanus/tdap  12/09/2023   Health Maintenance Review   ROS:  GEN/ HEENT: No fever, sweats headaches vision problems hearing changes, CV/ PULM; No chest pain shortness of breath cough, syncope,edema  change in exercise tolerance. GI /GU: No adominal pain, vomiting, change in bowel habits. No blood in the stool. No significant GU symptoms. SKIN/HEME: ,no acute skin rashes suspicious lesions or bleeding. No lymphadenopathy, nodules, masses.  NEURO/ PSYCH:  No neurologic signs such as weakness numbness. No depression anxiety. IMM/  Allergy: No unusual infections.  Allergy .  Has chronic nasal congestion? allergy on no meds . REST of 12 system review negative except as per HPI   Past Medical History  Diagnosis Date  . Thyroid disease     hypothyroidism hashimotos   . Depression     rx at same time as thyroid  on meds for over 20 years had some relpase when tried to go off feels normal on medication  . Osteopenia   . STD (sexually transmitted disease)     HPV  . Arthritis   . Hx of abnormal cervical Pap smear     rx cryo and leep in past    Family History  Problem Relation Age of Onset  . Arthritis Father   . Cancer Father     prostate  . Breast cancer Mother     about age 38  . Breast cancer Paternal Grandmother   . Celiac disease Sister     History   Social History  . Marital Status: Single    Spouse Name: N/A    Number of Children: N/A  . Years of Education: N/A   Social History Main Topics  . Smoking status: Never Smoker   . Smokeless tobacco: Never Used  . Alcohol Use: 2.5 oz/week    5 drink(s) per week     Comment: 5 vodkas a week  . Drug Use: No  . Sexual Activity: No   Other Topics Concern  . None   Social History Narrative   Regular exercise: seldom  Caffeine use: tea daily   Single porig from [place] parents in [place] has lived [place] and  [place]  In [place] for about 2-3 years.    6 hours of sleep per nights   Lives alone/no pets   Single    G2P2 children grown and healthy   BA degrees portfolio manager  [employer]          Outpatient Encounter Prescriptions as of 05/09/2014  Medication Sig  . buPROPion (WELLBUTRIN XL) 300 MG 24 hr tablet TAKE 1 TABLET BY MOUTH DAILY  . levothyroxine (SYNTHROID) 175 MCG tablet Take 1 tablet (175 mcg total) by mouth daily before breakfast.  . meloxicam (MOBIC) 7.5 MG tablet Take 7.5 mg by mouth as needed for pain.  Marland Kitchen sertraline (ZOLOFT) 50 MG tablet Take 1 tablet (50 mg total) by mouth daily. 1 and 1/2 daily  . [DISCONTINUED] albuterol  (PROVENTIL HFA;VENTOLIN HFA) 108 (90 BASE) MCG/ACT inhaler Inhale 1-2 puffs into the lungs every 6 (six) hours as needed for wheezing or shortness of breath.    EXAM:  BP 118/80  Pulse 67  Temp(Src) 98.6 F (37 C) (Oral)  Ht '5\' 4"'  (1.626 m)  Wt 145 lb 8 oz (65.998 kg)  BMI 24.96 kg/m2  LMP 12/03/2011  Body mass index is 24.96 kg/(m^2).  Physical Exam: Vital signs reviewed WSF:KCLE is a well-developed well-nourished alert cooperative    who appearsr stated age in no acute distress.  Congested looks allergic  HEENT: normocephalic atraumatic , Eyes: PERRL EOM's full, conjunctiva clear, Nares: congesterd no deformity discharge or tenderness., Ears: no deformity EAC's clear TMs with normal landmarks. Mouth: clear OP, no lesions, edema.  Moist mucous membranes. Dentition in adequate repair. NECK: supple without masses,or bruits. CHEST/PULM:  Clear to auscultation and percussion breath sounds equal no wheeze , rales or rhonchi. CV: PMI is nondisplaced, S1 S2 no gallops, murmurs, rubs. Peripheral pulses are full without delay.No JVD .  ABDOMEN: Bowel sounds normal nontender  No guard or rebound, no hepato splenomegal no CVA tenderness.  Extremtities:  No clubbing cyanosis or edema, no acute joint swelling or redness no focal atrophy NEURO:  Oriented x3, cranial nerves 3-12 appear to be intact, no obvious focal weakness,gait within normal limits no  SKIN: No acute rashes vitiligo changes  Lips 4 exptermkities and trunk  normal turgor,no bruising or petechiae. PSYCH: Oriented, good eye contact, no obvious depression anxiety, cognition and judgment appear normal. LN: no cervicaladenopathy  ASSESSMENT AND PLAN:  Discussed the following assessment and plan:  Hashimoto's disease - has been on brand  Synthroid for 20+ years onset when young pregnant.  - Plan: Basic metabolic panel, CBC with Differential, Hepatic function panel, Lipid panel, TSH, Celiac panel 10, HLA typing for celiac  disease  Vitiligo - since young age varisous treatments  in a current study  neg fam hx - Plan: Basic metabolic panel, CBC with Differential, Hepatic function panel, Lipid panel, TSH, Celiac panel 10, HLA typing for celiac disease  FHx: celiac disease - check panel and hla as has joint paints and hx of anemia   - Plan: Basic metabolic panel, CBC with Differential, Hepatic function panel, Lipid panel, TSH, Celiac panel 10, HLA typing for celiac disease  Preventive measure - Plan: Basic metabolic panel, CBC with Differential, Hepatic function panel, Lipid panel, TSH, Celiac panel 10, HLA typing for celiac disease  Allergic rhinitis - try otc antihistamine and nasal steroids   Joint pain - takes meloxicam off and on helps pre  GOLF  Mild anemia hx   Patient Care Team: Burnis Medin, MD as PCP - General (Internal Medicine) Patient Instructions  Will notify you  of labs when available. consdier seeing dermatology department at [facility].  Will refill  Medications   If you are doing well can do eyarly preventive visit and labs monitoring  And medication evaluation  Standley Brooking. Panosh M.D.

## 2014-05-09 NOTE — Progress Notes (Signed)
Pre visit review using our clinic review tool, if applicable. No additional management support is needed unless otherwise documented below in the visit note. 

## 2014-05-09 NOTE — Patient Instructions (Signed)
Will notify you  of labs when available. consdier seeing dermatology department at [facility].  Will refill  Medications   If you are doing well can do eyarly preventive visit and labs monitoring  And medication evaluation

## 2014-05-20 ENCOUNTER — Telehealth: Payer: Self-pay | Admitting: Internal Medicine

## 2014-05-20 NOTE — Telephone Encounter (Signed)
Pt would like a cb about her lab results 6/8

## 2014-05-23 ENCOUNTER — Telehealth: Payer: Self-pay | Admitting: Family Medicine

## 2014-05-23 ENCOUNTER — Other Ambulatory Visit: Payer: Self-pay | Admitting: Family Medicine

## 2014-05-23 MED ORDER — LEVOTHYROXINE SODIUM 150 MCG PO TABS: 150.0000 ug | ORAL_TABLET | Freq: Every day | ORAL | Status: DC

## 2014-05-23 NOTE — Telephone Encounter (Signed)
Patient would like to know how much iron she could take to help with her anemia.  Please advise.  Thanks!

## 2014-05-23 NOTE — Telephone Encounter (Signed)
Patient notified of results by telephone. 

## 2014-05-24 NOTE — Telephone Encounter (Signed)
I am not sure this is iron deficiency as we didn't do a specific iron level ..just a  Blood count.  Blood test show would be very unlikely that she has  Celiac.  Would have her get IBC panel and ferritin  And B12 level to  Help define anemia.  Also  Please ask about referral for colonoscopy Which should be done if not already.   (As I cant see that module in the ehr)

## 2014-05-25 NOTE — Telephone Encounter (Signed)
Pt is calling back requesting blood work results °

## 2014-05-27 NOTE — Telephone Encounter (Signed)
Left a message on voicemail.  Will try again at a later time.

## 2014-05-30 ENCOUNTER — Other Ambulatory Visit: Payer: Self-pay | Admitting: Family Medicine

## 2014-05-30 DIAGNOSIS — D649 Anemia, unspecified: Secondary | ICD-10-CM

## 2014-05-30 DIAGNOSIS — E063 Autoimmune thyroiditis: Secondary | ICD-10-CM

## 2014-05-30 NOTE — Telephone Encounter (Signed)
Patient notified of results by telephone.  Orders placed in the system.

## 2014-05-31 ENCOUNTER — Other Ambulatory Visit: Payer: Self-pay | Admitting: Obstetrics and Gynecology

## 2014-05-31 NOTE — Telephone Encounter (Signed)
Last refilled: 04/29/14 #30/0 refills  Last AEX: 12/08/13  AEX Scheduled: no current AEX scheduled.  Please Advise.  (Routed to Dr. Sabra Heck given Dr. Quincy Simmonds is out of office today.)

## 2014-06-01 NOTE — Telephone Encounter (Signed)
Left Message To Call Back  

## 2014-06-01 NOTE — Telephone Encounter (Signed)
Please call pt.  She recently saw Dr. Regis Bill as her new PCP.  She had weaned off her Wellbutrin and Zoloft but then restarted.  Can you clarify?  Does she needs both RXs?  Is she just back on the Wellbutrin.  I'm happy to RF it but would like some additional information.  Rx denied for now.

## 2014-06-02 ENCOUNTER — Other Ambulatory Visit: Payer: Self-pay | Admitting: Obstetrics and Gynecology

## 2014-06-02 NOTE — Telephone Encounter (Signed)
Dr. Regis Bill office is f/u with TSH/Synthroid  05/23/14 #90/1 refill sent in to [facility].

## 2014-06-06 NOTE — Telephone Encounter (Signed)
S/w Patient she gets this rx from Dr. Velora Mediate office this wasn't meant to come to our office.

## 2014-06-14 ENCOUNTER — Telehealth: Payer: Self-pay | Admitting: Internal Medicine

## 2014-06-14 DIAGNOSIS — F32A Depression, unspecified: Secondary | ICD-10-CM

## 2014-06-14 DIAGNOSIS — F329 Major depressive disorder, single episode, unspecified: Secondary | ICD-10-CM

## 2014-06-14 NOTE — Telephone Encounter (Signed)
Will check with WP to make sure she is taking over all medications and to ask how long to fill.

## 2014-06-14 NOTE — Telephone Encounter (Signed)
Ok to refill wellbutrin and zoloft for a year and synthroid for 6 months  She is due for recheck  thyroidin a few months

## 2014-06-14 NOTE — Telephone Encounter (Signed)
Pt req rx on the following meds  buPROPion (WELLBUTRIN XL) 300 MG 24 hr tablet, sertraline (ZOLOFT) 50 MG tablet,  levothyroxine (SYNTHROID, LEVOTHROID) 150 MCG tablet    Pharmacy [facility]  Phone number [phone]

## 2014-06-16 MED ORDER — LEVOTHYROXINE SODIUM 150 MCG PO TABS: 150.0000 ug | ORAL_TABLET | Freq: Every day | ORAL | Status: DC

## 2014-06-16 MED ORDER — SERTRALINE HCL 50 MG PO TABS: 75.0000 mg | ORAL_TABLET | Freq: Every day | ORAL | Status: DC

## 2014-06-16 MED ORDER — BUPROPION HCL ER (XL) 300 MG PO TB24: ORAL_TABLET | ORAL | Status: DC

## 2014-06-16 NOTE — Telephone Encounter (Signed)
Sent to the pharmacy by e-scribe. 

## 2014-08-30 ENCOUNTER — Other Ambulatory Visit: Payer: BC Managed Care – PPO

## 2014-10-03 ENCOUNTER — Encounter: Payer: Self-pay | Admitting: Internal Medicine

## 2014-12-07 ENCOUNTER — Other Ambulatory Visit: Payer: Self-pay

## 2014-12-07 DIAGNOSIS — Z1231 Encounter for screening mammogram for malignant neoplasm of breast: Secondary | ICD-10-CM

## 2014-12-11 ENCOUNTER — Other Ambulatory Visit: Payer: Self-pay | Admitting: Internal Medicine

## 2015-01-02 ENCOUNTER — Ambulatory Visit
Admission: RE | Admit: 2015-01-02 | Discharge: 2015-01-02 | Disposition: A | Discharge status: Home / Self Care | Payer: BLUE CROSS/BLUE SHIELD | Source: Ambulatory Visit

## 2015-01-02 DIAGNOSIS — Z1231 Encounter for screening mammogram for malignant neoplasm of breast: Secondary | ICD-10-CM

## 2015-02-14 ENCOUNTER — Telehealth: Payer: Self-pay | Admitting: *Deleted

## 2015-02-14 NOTE — Telephone Encounter (Signed)
08 Pap recall due 12/2014 due to previous LEEP and pos HR HPV 2015  Past History:   12/08/13, negative with pos HR HPV, neg 16/18 Pt reports LEEP in 2006 or 2007.   New to practice in 12/2013.  Pt has not scheduled AEX with Dr. Quincy Simmonds.  Please call pt to schedule AEX.  Thank you.

## 2015-02-14 NOTE — Telephone Encounter (Signed)
Recall extended to 04/01/15. Routing to provider for final review.  Closing encounter.

## 2015-02-14 NOTE — Telephone Encounter (Signed)
Called patient and scheduled her for AEX 03/16/15 at 8:00 with Dr. Quincy Simmonds.

## 2015-03-16 ENCOUNTER — Encounter: Payer: Self-pay | Admitting: Obstetrics and Gynecology

## 2015-03-16 ENCOUNTER — Ambulatory Visit (INDEPENDENT_AMBULATORY_CARE_PROVIDER_SITE_OTHER): Payer: BLUE CROSS/BLUE SHIELD | Admitting: Obstetrics and Gynecology

## 2015-03-16 VITALS — BP 130/74 | HR 70 | Resp 20 | Ht 64.0 in | Wt 148.6 lb

## 2015-03-16 DIAGNOSIS — D259 Leiomyoma of uterus, unspecified: Secondary | ICD-10-CM

## 2015-03-16 DIAGNOSIS — M8580 Other specified disorders of bone density and structure, unspecified site: Secondary | ICD-10-CM

## 2015-03-16 DIAGNOSIS — Z01419 Encounter for gynecological examination (general) (routine) without abnormal findings: Secondary | ICD-10-CM

## 2015-03-16 DIAGNOSIS — R319 Hematuria, unspecified: Secondary | ICD-10-CM

## 2015-03-16 DIAGNOSIS — Z862 Personal history of diseases of the blood and blood-forming organs and certain disorders involving the immune mechanism: Secondary | ICD-10-CM | POA: Diagnosis not present

## 2015-03-16 DIAGNOSIS — Z0000 Encounter for general adult medical examination without abnormal findings: Secondary | ICD-10-CM

## 2015-03-16 LAB — POCT URINALYSIS DIPSTICK
Bilirubin, UA: NEGATIVE
Glucose, UA: NEGATIVE
KETONES UA: NEGATIVE
LEUKOCYTES UA: NEGATIVE
NITRITE UA: NEGATIVE
PROTEIN UA: NEGATIVE
Urobilinogen, UA: NEGATIVE
pH, UA: 5

## 2015-03-16 LAB — CBC
HEMATOCRIT: 37.7 % (ref 36.0–46.0)
Hemoglobin: 12.3 g/dL (ref 12.0–15.0)
MCH: 31 pg (ref 26.0–34.0)
MCHC: 32.6 g/dL (ref 30.0–36.0)
MCV: 95 fL (ref 78.0–100.0)
MPV: 9.7 fL (ref 8.6–12.4)
Platelets: 312 10*3/uL (ref 150–400)
RBC: 3.97 MIL/uL (ref 3.87–5.11)
RDW: 13.3 % (ref 11.5–15.5)
WBC: 5.2 10*3/uL (ref 4.0–10.5)

## 2015-03-16 NOTE — Addendum Note (Signed)
Addended by: Lowella Fairy on: 03/16/2015 09:13 AM   Modules accepted: Orders

## 2015-03-16 NOTE — Progress Notes (Signed)
Patient ID: Lori Carney, female   DOB: 11-29-1961, 54 y.o.   MRN: [id number] 54 y.o. [id number] SingleCaucasianF here for annual exam.    Mother and paternal grandmother with breast cancer.  Patient has breast implants.   Taking Wellbutrin and Zoloft for the last 25 years.   Sees endocrinology for thyroid management.  Sees Dr. Elyse Hsu.  PCP:  Rachell Cipro, MD - Just had first appointment.   Patient's last menstrual period was 12/03/2011.          Sexually active: No. female The current method of family planning is post menopausal status.    Exercising: No.  none. Smoker:  no  Health Maintenance: Pap:  12-08-13 wnl:Pos HR HPV--neg 16/18 History of abnormal Pap:  Yes, pap wnl/Pos HR HPV 12-08-13, Hx LEEP in 2006 or 2007 MMG:  01-02-15 dense/nl:[facility] Colonoscopy:  2007 normal in [place] BMD:   n/a TDaP:  12/2013 Screening Labs:  Urine today:  Large RBCs.   reports that she has never smoked. She has never used smokeless tobacco. She reports that she drinks about 8.4 oz of alcohol per week. She reports that she does not use illicit drugs.  Past Medical History  Diagnosis Date  . Thyroid disease     hypothyroidism hashimotos   . Depression     rx at same time as thyroid  on meds for over 20 years had some relpase when tried to go off feels normal on medication  . Osteopenia   . STD (sexually transmitted disease)     HPV  . Arthritis   . Hx of abnormal cervical Pap smear     rx cryo and leep in past    Past Surgical History  Procedure Laterality Date  . Breast surgery      cysts removed from Lt and Rt. breasts-benign    Current Outpatient Prescriptions  Medication Sig Dispense Refill  . buPROPion (WELLBUTRIN XL) 150 MG 24 hr tablet Take 1 tablet by mouth daily.  0  . sertraline (ZOLOFT) 50 MG tablet Take 1.5 tablets (75 mg total) by mouth daily. 45 tablet 11  . SYNTHROID 75 MCG tablet Take 1 tablet by mouth daily.  11   No current facility-administered  medications for this visit.    Family History  Problem Relation Age of Onset  . Arthritis Father   . Cancer Father     prostate  . Breast cancer Mother     about age 29  . Breast cancer Paternal Grandmother   . Celiac disease Sister     ROS:  Pertinent items are noted in HPI.  Otherwise, a comprehensive ROS was negative.  Exam:   BP 130/74 mmHg  Pulse 70  Resp 20  Ht 5\' 4"  (1.626 m)  Wt 148 lb 9.6 oz (67.405 kg)  BMI 25.49 kg/m2  LMP 12/03/2011      Height: 5\' 4"  (162.6 cm)  Ht Readings from Last 3 Encounters:  03/16/15 5\' 4"  (1.626 m)  05/09/14 5\' 4"  (1.626 m)  12/08/13 5\' 4"  (1.626 m)    General appearance: alert, cooperative and appears stated age Head: Normocephalic, without obvious abnormality, atraumatic Neck: no adenopathy, supple, symmetrical, trachea midline and thyroid normal to inspection and palpation Lungs: clear to auscultation bilaterally Breasts: normal appearance, no masses or tenderness, No nipple retraction or dimpling, No nipple discharge or bleeding, No axillary or supraclavicular adenopathy, bilateral implants. Heart: regular rate and rhythm Abdomen: soft, non-tender; bowel sounds normal; no masses,  no organomegaly Extremities:  extremities normal, atraumatic, no cyanosis or edema Skin: Texture, turgor normal. No rashes or lesions.  Hypopigmentation areas. Lymph nodes: Cervical, supraclavicular, and axillary nodes normal. No abnormal inguinal nodes palpated Neurologic: Grossly normal   Pelvic: External genitalia:  no lesions              Urethra:  normal appearing urethra with no masses, tenderness or lesions              Bartholins and Skenes: normal                 Vagina: normal appearing vagina with normal color and discharge, no lesions              Cervix: consistent iwht LEEP.              Pap taken: Yes.   Bimanual Exam:  Uterus:  Irregular texture of anterior uterus - fibroids?              Adnexa: normal adnexa                Rectovaginal: Confirms               Anus:  normal sphincter tone, no lesions  Chaperone was present for exam.  A:  Well Woman with normal exam Normal gynecologic exam. Microscopic hematuria.  Possible uterine fibroids. Osteopenia. History of LEEP and cryotherapy to cervix Family history of breast cancer Has bilateral breast implants Hypothyroidism Vitiligo   P:   Mammogram yearly. pap smear and HR HPV testing.  Return for pelvic ultrasound.  Urine micro and culture.  May need to return to urology. Will do bone density.  Patient will call to schedule appointment.  Order placed.  Routine labs. return annually or prn

## 2015-03-16 NOTE — Patient Instructions (Signed)

## 2015-03-17 ENCOUNTER — Telehealth: Payer: Self-pay | Admitting: Obstetrics and Gynecology

## 2015-03-17 LAB — COMPREHENSIVE METABOLIC PANEL
ALT: 18 U/L (ref 0–35)
AST: 19 U/L (ref 0–37)
Albumin: 4.3 g/dL (ref 3.5–5.2)
Alkaline Phosphatase: 38 U/L — ABNORMAL LOW (ref 39–117)
BUN: 16 mg/dL (ref 6–23)
CO2: 28 mEq/L (ref 19–32)
Calcium: 9.6 mg/dL (ref 8.4–10.5)
Chloride: 105 mEq/L (ref 96–112)
Creat: 0.74 mg/dL (ref 0.50–1.10)
GLUCOSE: 62 mg/dL — AB (ref 70–99)
Potassium: 4.9 mEq/L (ref 3.5–5.3)
SODIUM: 141 meq/L (ref 135–145)
TOTAL PROTEIN: 6.6 g/dL (ref 6.0–8.3)
Total Bilirubin: 0.4 mg/dL (ref 0.2–1.2)

## 2015-03-17 LAB — URINALYSIS, MICROSCOPIC ONLY
Bacteria, UA: NONE SEEN
CASTS: NONE SEEN
Crystals: NONE SEEN
SQUAMOUS EPITHELIAL / LPF: NONE SEEN

## 2015-03-17 LAB — LIPID PANEL
CHOL/HDL RATIO: 2.2 ratio
CHOLESTEROL: 261 mg/dL — AB (ref 0–200)
HDL: 120 mg/dL (ref >=46)
LDL Cholesterol: 118 mg/dL — ABNORMAL HIGH (ref 0–99)
TRIGLYCERIDES: 116 mg/dL (ref <150)
VLDL: 23 mg/dL (ref 0–40)

## 2015-03-17 LAB — IRON: Iron: 102 ug/dL (ref 42–145)

## 2015-03-17 LAB — URINE CULTURE
Colony Count: NO GROWTH
Organism ID, Bacteria: NO GROWTH

## 2015-03-17 NOTE — Telephone Encounter (Signed)
Patient called with the date of 03/23/15 [facility]. She says she was told to call with this information.

## 2015-03-17 NOTE — Telephone Encounter (Signed)
Call to patient. Advised of benefit quote received for PUS. °Patient agreeable. Scheduled PUS. °Advised patient of 72 hour cancellation policy and $100 cancellation fee. Patient agreeable. °

## 2015-03-17 NOTE — Telephone Encounter (Signed)
Routing to Dr.Silva as FYI.  Routing to provider for final review. Patient agreeable to disposition. Will close encounter

## 2015-03-20 LAB — IPS PAP TEST WITH HPV

## 2015-03-23 ENCOUNTER — Ambulatory Visit
Admission: RE | Admit: 2015-03-23 | Discharge: 2015-03-23 | Disposition: A | Discharge status: Home / Self Care | Payer: BLUE CROSS/BLUE SHIELD | Source: Ambulatory Visit | Attending: Obstetrics and Gynecology | Admitting: Obstetrics and Gynecology

## 2015-03-23 DIAGNOSIS — M8580 Other specified disorders of bone density and structure, unspecified site: Secondary | ICD-10-CM

## 2015-03-30 ENCOUNTER — Ambulatory Visit (INDEPENDENT_AMBULATORY_CARE_PROVIDER_SITE_OTHER): Payer: BLUE CROSS/BLUE SHIELD | Admitting: Obstetrics and Gynecology

## 2015-03-30 ENCOUNTER — Encounter: Payer: Self-pay | Admitting: Obstetrics and Gynecology

## 2015-03-30 ENCOUNTER — Ambulatory Visit (INDEPENDENT_AMBULATORY_CARE_PROVIDER_SITE_OTHER): Payer: BLUE CROSS/BLUE SHIELD

## 2015-03-30 VITALS — BP 110/76 | Resp 14 | Ht 64.0 in | Wt 144.0 lb

## 2015-03-30 DIAGNOSIS — D259 Leiomyoma of uterus, unspecified: Secondary | ICD-10-CM | POA: Diagnosis not present

## 2015-03-30 NOTE — Progress Notes (Signed)
Subjective  54 y.o. [id number]  @Caucasian  female here for pelvic ultrasound for evaluation of possible fibroids noted on routine pelvic exam. Postmenopausal, not on HRT.  Labs also reviewed from annual exam visit.   Objective  Pelvic ultrasound images and report reviewed with patient.  Uterus - 2 fibroids - 19 mm posterior fundal intramural and 12 mm anterior subserosal. EMS - 2.24 mm Ovaries - normal Free fluid - no     Assessment  Small uterine fibroids.  Asymptomatic. Normal recent labs at annual exam visit.   Plan  Discussion of fibroids etiology, symptoms, general benign nature. ACOG handout to patient on fibroids.  Will do observational management of fibroids and check with pelvic exam annually.   __15_____ minutes face to face time of which over 50% was spent in counseling.   After visit summary to patient.

## 2015-07-04 ENCOUNTER — Other Ambulatory Visit: Payer: Self-pay | Admitting: Family Medicine

## 2015-07-04 ENCOUNTER — Other Ambulatory Visit: Payer: Self-pay | Admitting: Internal Medicine

## 2015-07-04 ENCOUNTER — Telehealth: Payer: Self-pay | Admitting: Family Medicine

## 2015-07-04 DIAGNOSIS — Z0000 Encounter for general adult medical examination without abnormal findings: Secondary | ICD-10-CM

## 2015-07-04 NOTE — Telephone Encounter (Signed)
Pt is now due for cpx.  I have placed the lab orders.  Please help her to make both appointments.  Thanks!

## 2015-07-04 NOTE — Telephone Encounter (Signed)
Sent in for 30 days.  Pt is now due for her CPX.  Message sent to scheduling.

## 2015-07-05 NOTE — Telephone Encounter (Signed)
lmom for pt to call back

## 2015-07-07 NOTE — Telephone Encounter (Signed)
lmom for pt to call back

## 2015-07-13 NOTE — Telephone Encounter (Signed)
lmom for pt to call back

## 2015-08-02 ENCOUNTER — Other Ambulatory Visit: Payer: Self-pay | Admitting: Internal Medicine

## 2015-08-02 NOTE — Telephone Encounter (Signed)
Sent to the pharmacy by e-scribe for 15 tabs.  Pt needs appointment.  The front desk has tried to reach her by telephone for a cpx appt.  See telephone note.

## 2015-08-09 ENCOUNTER — Other Ambulatory Visit: Payer: Self-pay | Admitting: Internal Medicine

## 2015-08-10 NOTE — Telephone Encounter (Signed)
Denied.  Pt is past due for cpx.  Attempts have been made to reach her.  Last refill was for 15 pills.

## 2015-12-21 ENCOUNTER — Other Ambulatory Visit: Payer: Self-pay

## 2015-12-21 DIAGNOSIS — Z1231 Encounter for screening mammogram for malignant neoplasm of breast: Secondary | ICD-10-CM

## 2016-02-19 ENCOUNTER — Ambulatory Visit
Admission: RE | Admit: 2016-02-19 | Discharge: 2016-02-19 | Disposition: A | Discharge status: Home / Self Care | Payer: No Typology Code available for payment source | Source: Ambulatory Visit

## 2016-02-19 DIAGNOSIS — Z1231 Encounter for screening mammogram for malignant neoplasm of breast: Secondary | ICD-10-CM

## 2016-04-03 ENCOUNTER — Ambulatory Visit (INDEPENDENT_AMBULATORY_CARE_PROVIDER_SITE_OTHER): Payer: PRIVATE HEALTH INSURANCE | Admitting: Obstetrics and Gynecology

## 2016-04-03 ENCOUNTER — Encounter: Payer: Self-pay | Admitting: Obstetrics and Gynecology

## 2016-04-03 VITALS — BP 104/64 | HR 64 | Resp 20 | Ht 63.5 in | Wt 140.0 lb

## 2016-04-03 DIAGNOSIS — Z113 Encounter for screening for infections with a predominantly sexual mode of transmission: Secondary | ICD-10-CM | POA: Diagnosis not present

## 2016-04-03 DIAGNOSIS — Z01419 Encounter for gynecological examination (general) (routine) without abnormal findings: Secondary | ICD-10-CM

## 2016-04-03 NOTE — Progress Notes (Signed)
Patient ID: Lori Carney, female   DOB: August 28, 1961, 55 y.o.   MRN: [id number] 55 y.o. [id number] Single Caucasian female here for annual exam.    Ultrasound 2016 - 2 fibroids - 19 mm posterior fundal intramural and 12 mm anterior subserosal.  Taking iron every other day for anemia.   Taking calcium and vit D also.   Daughter marring in [place] this summer.  Patient is from [place] originally.   PCP:   Rachell Cipro, MD Endocrinology:  Lorne Skeens, MD  Patient's last menstrual period was 12/03/2011.           Sexually active: No. female The current method of family planning is post menopausal status.    Exercising: No.   Smoker:  no  Health Maintenance: Pap:  03-16-15 Neg:Neg HR HPV History of abnormal Pap:  Yes, pap wnl/Pos HR HPV 12-08-13, Subtypes 16/18 negative each, Hx LEEP in 2006 or 2007. MMG:  02-29-16 Density C/Neg/BiRads1:[facility] Colonoscopy:  2007 normal in [place] per patient;next due this year.   BMD:   03-24-15  Result:Osteopenia:[facility]. Spine -2.0, hip -1.7. TDaP:  12/2013 Gardasil:   N/A HIV:  Negative in [place].  Hep C: will do today.  Screening Labs:  Hb today: PCP, Urine today: unable to void   reports that she has never smoked. She has never used smokeless tobacco. She reports that she drinks about 3.0 oz of alcohol per week. She reports that she does not use illicit drugs.  Past Medical History  Diagnosis Date  . Thyroid disease     hypothyroidism hashimotos   . Depression     rx at same time as thyroid  on meds for over 20 years had some relpase when tried to go off feels normal on medication  . Osteopenia   . STD (sexually transmitted disease)     HPV  . Arthritis   . Hx of abnormal cervical Pap smear     rx cryo and leep in past  . Fibroid     Past Surgical History  Procedure Laterality Date  . Breast surgery      cysts removed from Lt and Rt. breasts-benign    Current Outpatient Prescriptions  Medication Sig Dispense  Refill  . buPROPion (WELLBUTRIN XL) 150 MG 24 hr tablet Take 1 tablet by mouth daily.  0  . cholecalciferol (VITAMIN D) 1000 units tablet Take 3,000 Units by mouth daily.    . sertraline (ZOLOFT) 50 MG tablet TAKE 1 AND 1/2 TABLETS BY MOUTH DAILY 15 tablet 0  . SYNTHROID 75 MCG tablet Take 1 tablet by mouth daily.  11   No current facility-administered medications for this visit.    Family History  Problem Relation Age of Onset  . Arthritis Father   . Cancer Father     prostate  . Breast cancer Mother     about age 35  . Breast cancer Paternal Grandmother   . Celiac disease Sister     ROS:  Pertinent items are noted in HPI.  Otherwise, a comprehensive ROS was negative.  Exam:   BP 104/64 mmHg  Pulse 64  Resp 20  Ht 5' 3.5" (1.613 m)  Wt 140 lb (63.504 kg)  BMI 24.41 kg/m2  LMP 12/03/2011    General appearance: alert, cooperative and appears stated age Head: Normocephalic, without obvious abnormality, atraumatic Neck: no adenopathy, supple, symmetrical, trachea midline and thyroid normal to inspection and palpation Lungs: clear to auscultation bilaterally Breasts: normal appearance, no masses  or tenderness, Inspection negative, No nipple retraction or dimpling, No nipple discharge or bleeding, No axillary or supraclavicular adenopathy.  Bilateral breast implants. Heart: regular rate and rhythm Abdomen: incisions:  No.    , soft, non-tender; no masses, no organomegaly Extremities: extremities normal, atraumatic, no cyanosis or edema Skin: Skin color, texture, turgor normal. No rashes or lesions Lymph nodes: Cervical, supraclavicular, and axillary nodes normal. No abnormal inguinal nodes palpated Neurologic: Grossly normal  Pelvic: External genitalia:  no lesions.  Vitiligo noted.              Urethra:  normal appearing urethra with no masses, tenderness or lesions              Bartholins and Skenes: normal                 Vagina: normal appearing vagina with normal color  and discharge, no lesions              Cervix: no lesions              Pap taken: Yes.   Bimanual Exam:  Uterus:  normal size, contour, position, consistency, mobility, non-tender              Adnexa: no mass, fullness, tenderness              Rectal exam: Yes.  .  Confirms.              Anus:  normal sphincter tone, no lesions.  Stool in the rectum.   Chaperone was present for exam.  Assessment:   Well woman visit with normal exam. Uterine fibroids. Osteopenia. History of LEEP and cryotherapy to cervix.  Positive HR HPV and negative 16/18 in 2015. Family history of breast cancer Has bilateral breast implants Hypothyroidism Vitiligo  Plan: Yearly mammogram recommended after age 22.  Recommended self breast exam.  Pap and HR HPV as above. Discussed Calcium, Vitamin D, regular exercise program including cardiovascular and weight bearing exercise. Labs performed.   Hep C today.  Bone density next year.  Colonoscopy recommended.  Prescription medication(s) given.  No..    Follow up annually and prn.       After visit summary provided.

## 2016-04-03 NOTE — Patient Instructions (Signed)

## 2016-04-04 LAB — HEPATITIS C ANTIBODY: HCV AB: NEGATIVE

## 2016-04-08 LAB — IPS PAP TEST WITH HPV

## 2016-04-11 ENCOUNTER — Telehealth: Payer: Self-pay

## 2016-04-11 DIAGNOSIS — R87610 Atypical squamous cells of undetermined significance on cytologic smear of cervix (ASC-US): Secondary | ICD-10-CM

## 2016-04-11 NOTE — Telephone Encounter (Signed)
Spoke with patient. Results given as seen below from [place]. She is agreeable and verbalizes understanding. Patient has many questions. Offered to schedule an appointment to discuss results with Dr.Silva prior to scheduling her colposcopy, but she declines. Patient would like to proceed with scheduling at this time and speak with Dr.Silva at her appointment. Colposcopy scheduled for 04/22/2016 at 10 am with Dr.Silva. She is agreeable to date and time.   Instructions given. Motrin 800 mg po x , one hour before appointment with food. Make sure to eat a meal before appointment and drink plenty of fluids. Order placed for precert.  Cc: Lerry Liner for precert  Routing to provider for final review. Patient agreeable to disposition. Will close encounter.

## 2016-04-11 NOTE — Telephone Encounter (Signed)
-----   Message from Nunzio Cobbs, MD sent at 04/11/2016  9:26 AM EDT ----- Please contact patient with abnormal pap results Pap showing atypia and HR HPV status is negative.  There is no sign of cancer.  I am recommending colposcopy with me.   Has a history of 2 prior LEEPs.  Cc- Marisa Sprinkles

## 2016-04-15 ENCOUNTER — Telehealth: Payer: Self-pay | Admitting: Obstetrics and Gynecology

## 2016-04-15 NOTE — Telephone Encounter (Signed)
Call to patient to discuss benefits for colposcopy scheduled 04/22/16. Per automated message "mailbox is full and cannot accept messages at this time". Unable to leave message.

## 2016-04-22 ENCOUNTER — Ambulatory Visit (INDEPENDENT_AMBULATORY_CARE_PROVIDER_SITE_OTHER): Payer: PRIVATE HEALTH INSURANCE | Admitting: Obstetrics and Gynecology

## 2016-04-22 ENCOUNTER — Encounter: Payer: Self-pay | Admitting: Obstetrics and Gynecology

## 2016-04-22 VITALS — BP 110/68 | HR 76 | Resp 16 | Ht 63.5 in | Wt 140.0 lb

## 2016-04-22 DIAGNOSIS — R896 Abnormal cytological findings in specimens from other organs, systems and tissues: Secondary | ICD-10-CM

## 2016-04-22 DIAGNOSIS — IMO0002 Reserved for concepts with insufficient information to code with codable children: Secondary | ICD-10-CM

## 2016-04-22 DIAGNOSIS — R87610 Atypical squamous cells of undetermined significance on cytologic smear of cervix (ASC-US): Secondary | ICD-10-CM

## 2016-04-22 NOTE — Progress Notes (Signed)
GYNECOLOGY  VISIT   HPI: 55 y.o.   Single  Caucasian  female   G2P2002 with Patient's last menstrual period was 12/03/2011.   here for   Colposcopy.  Pap ASCUS and positive HR HPV.  Hx of 2 prior LEEPs.  Pap last year was normal and had negative HR HPV.  Pap in 2015 was normal and positive HR HPV and negative subtypes 16 and 18.   Daughter marrying in August.   GYNECOLOGIC HISTORY: Patient's last menstrual period was 12/03/2011. Contraception:  Post Menopausal Menopausal hormone therapy:  none Last mammogram:  02-29-16 Density C/Neg/BiRads1:[facility] Last pap smear:  04/03/16 Pap showing atypia and HR HPV status is negative. There is no sign of cancer.         OB History    Gravida Para Term Preterm AB TAB SAB Ectopic Multiple Living   2 2 2       2          Patient Active Problem List   Diagnosis Date Noted  . Allergic rhinitis 05/09/2014  . Joint pain 05/09/2014  . Preventive measure 05/09/2014  . Hashimoto's disease 04/06/2013  . Vitiligo 04/06/2013  . FHx: celiac disease 04/06/2013    Past Medical History  Diagnosis Date  . Thyroid disease     hypothyroidism hashimotos   . Depression     rx at same time as thyroid  on meds for over 20 years had some relpase when tried to go off feels normal on medication  . Osteopenia   . STD (sexually transmitted disease)     HPV  . Arthritis   . Hx of abnormal cervical Pap smear     rx cryo and leep in past  . Fibroid     Past Surgical History  Procedure Laterality Date  . Breast surgery      cysts removed from Lt and Rt. breasts-benign    Current Outpatient Prescriptions  Medication Sig Dispense Refill  . buPROPion (WELLBUTRIN XL) 150 MG 24 hr tablet Take 1 tablet by mouth daily.  0  . cholecalciferol (VITAMIN D) 1000 units tablet Take 3,000 Units by mouth daily.    . sertraline (ZOLOFT) 50 MG tablet TAKE 1 AND 1/2 TABLETS BY MOUTH DAILY 15 tablet 0  . SYNTHROID 75 MCG tablet Take 1 tablet by mouth daily.  11   . clobetasol cream (TEMOVATE) 0.05 %      No current facility-administered medications for this visit.     ALLERGIES: Macrobid  Family History  Problem Relation Age of Onset  . Arthritis Father   . Cancer Father     prostate  . Breast cancer Mother     about age 31  . Breast cancer Paternal Grandmother   . Celiac disease Sister     Social History   Social History  . Marital Status: Single    Spouse Name: N/A  . Number of Children: N/A  . Years of Education: N/A   Occupational History  . Not on file.   Social History Main Topics  . Smoking status: Never Smoker   . Smokeless tobacco: Never Used  . Alcohol Use: 3.0 oz/week    5 Standard drinks or equivalent per week     Comment: 5 vodkas a week  . Drug Use: No  . Sexual Activity: No   Other Topics Concern  . Not on file   Social History Narrative   Regular exercise: seldom   Caffeine use: tea daily  Single porig from [place] parents in [place] has lived [place] and  [place]  In [place] for about 2-3 years.    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  PHYSICAL EXAMINATION:    BP 110/68 mmHg  Pulse 76  Resp 16  Ht 5' 3.5" (1.613 m)  Wt 140 lb (63.504 kg)  BMI 24.41 kg/m2  LMP 12/03/2011    General appearance: alert, cooperative and appears stated age   Colposcopy procedure -  Consent performed.  Speculum placed in vagina.  3% acetic acid used.  Colposcopy satisfactory.  Appearance of cervix consistent with LEEP.  Has a cervical os that is about 7 mm in diameter with very clear endocervix visible.  No lesions seen. Green filter light used and no abnormalities seen.  ECC taken and sent to pathology.  Monsel's used.  Minimal EBL.  No complications.  Procedure tolerated well.   Chaperone was present for exam.  ASSESSMENT  ASCUS and positive HR HPV.  Hx  LEEP x 2.   PLAN  Discussion of HPV, abnormal paps, and colposcopy procedure.  Follow up ECC results.  I anticipated pap and HR HPV testing in one year.   An After Visit Summary was printed and given to the patient.

## 2016-04-25 LAB — IPS OTHER TISSUE BIOPSY

## 2016-04-30 ENCOUNTER — Other Ambulatory Visit: Payer: Self-pay | Admitting: Obstetrics and Gynecology

## 2016-04-30 ENCOUNTER — Telehealth: Payer: Self-pay | Admitting: Emergency Medicine

## 2016-04-30 DIAGNOSIS — IMO0002 Reserved for concepts with insufficient information to code with codable children: Secondary | ICD-10-CM

## 2016-04-30 NOTE — Telephone Encounter (Signed)
Patient returned call

## 2016-04-30 NOTE — Telephone Encounter (Signed)
Return call to patient. Discussed lab results from Dr. Quincy Simmonds. Patient verbalized understanding of results and recommendations from Dr. Quincy Simmonds.   Scheduled pap and ECC for 08/08/16 at 1500 with Dr. Quincy Simmonds.   Pre-procedure instructions given. Motrin 800 mg PO one hour before appointment with food and water. Patient advised to call back with any questions or concerns prior to appointment.  Routing to provider for final review. Patient agreeable to disposition. Will close encounter.

## 2016-04-30 NOTE — Telephone Encounter (Signed)
Message left to return call to [facility] at [phone].   06 Recall entered for 3 months for PAP and ECC (Colposcopy ordered for pre cert for future appointment).

## 2016-04-30 NOTE — Telephone Encounter (Signed)
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  I am recommending a pap and ECC with me in 3 months.  Please place a recall.  Cc- Marisa Sprinkles

## 2016-05-03 NOTE — Addendum Note (Signed)
Addended by: Michele Mcalpine on: 05/03/2016 04:04 PM   Modules accepted: Orders

## 2016-05-08 NOTE — Telephone Encounter (Signed)
Return call to patient. States she is concerned about waiting 3 months for repeat pap and ECC, requests clarification why repeat should not be done now? Discussed that usually allow body time for dysplasia to regress or progress on its own. Repeating now would likely yield same result. 3 months will allow time for change without so much time that dysplasia could progress to alter treatment plan. Offered office visit with Dr Quincy Simmonds for review of results and plan. Patient declines for now. States she is satisfied with this answer. Advised Dr Quincy Simmonds will review call and information provided. Will call her back if Dr Quincy Simmonds has additional information or recommendations to share with patient.   Routing to provider for final review. Patient agreeable to disposition. Will close encounter.

## 2016-05-08 NOTE — Telephone Encounter (Signed)
Patient called and left a message on the machine at lunch stating, "I have some questions for the nurse about my upcoming procedure."

## 2016-08-08 ENCOUNTER — Encounter: Payer: Self-pay | Admitting: Obstetrics and Gynecology

## 2016-08-08 ENCOUNTER — Other Ambulatory Visit: Payer: Self-pay | Admitting: Obstetrics and Gynecology

## 2016-08-08 ENCOUNTER — Ambulatory Visit (INDEPENDENT_AMBULATORY_CARE_PROVIDER_SITE_OTHER): Payer: 59 | Admitting: Obstetrics and Gynecology

## 2016-08-08 VITALS — BP 108/60 | HR 88 | Ht 63.5 in | Wt 139.0 lb

## 2016-08-08 DIAGNOSIS — R896 Abnormal cytological findings in specimens from other organs, systems and tissues: Secondary | ICD-10-CM | POA: Diagnosis not present

## 2016-08-08 DIAGNOSIS — IMO0002 Reserved for concepts with insufficient information to code with codable children: Secondary | ICD-10-CM

## 2016-08-08 DIAGNOSIS — R87810 Cervical high risk human papillomavirus (HPV) DNA test positive: Secondary | ICD-10-CM

## 2016-08-08 NOTE — Progress Notes (Signed)
GYNECOLOGY  VISIT   HPI: 55 y.o.   Single  Caucasian  female   G2P2002 with Patient's last menstrual period was 12/03/2011.   here for repeat pap and ECC.    Pap 04/03/16 - ASCUS and positive HR HPV.  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   Pap in 2016 was negative and negative HR HPV.  Pap in 2015 was normal and positive HR HPV and negative subtypes 16 and 18.   Hx of prior LEEP and cryotherapy.  Saw PCP recently.  Dx with vit D deficiency.   GYNECOLOGIC HISTORY: Patient's last menstrual period was 12/03/2011. Contraception:  Postmenopausal Menopausal hormone therapy:  none Last mammogram:  02-29-16 Density C[facility]Neg/BiRads[facility] Last pap smear:  04-03-16 ASCUS:Pos HR HPV--colposcopy 04-22-16 Neg ECC, with small piece of dysplasia that was difficult to grade but most consistent with LGSIL. Hx LEEP x2.         OB History    Gravida Para Term Preterm AB Living   2 2 2     2    SAB TAB Ectopic Multiple Live Births                     Patient Active Problem List   Diagnosis Date Noted  . Allergic rhinitis 05/09/2014  . Joint pain 05/09/2014  . Preventive measure 05/09/2014  . Hashimoto's disease 04/06/2013  . Vitiligo 04/06/2013  . FHx: celiac disease 04/06/2013    Past Medical History:  Diagnosis Date  . Anemia   . Arthritis   . Depression    rx at same time as thyroid  on meds for over 20 years had some relpase when tried to go off feels normal on medication  . Fibroid   . Hx of abnormal cervical Pap smear    rx cryo and leep in past  . Osteopenia   . STD (sexually transmitted disease)    HPV  . Thyroid disease    hypothyroidism hashimotos   . Vitamin D deficiency     Past Surgical History:  Procedure Laterality Date  . BREAST SURGERY     cysts removed from Lt and Rt. breasts-benign    Current Outpatient Prescriptions  Medication Sig Dispense Refill   . buPROPion (WELLBUTRIN XL) 150 MG 24 hr tablet Take 1 tablet by mouth daily.  0  . cholecalciferol (VITAMIN D) 1000 units tablet Take 3,000 Units by mouth daily.    . clobetasol cream (TEMOVATE) 0.05 %     . sertraline (ZOLOFT) 50 MG tablet TAKE 1 AND 1/2 TABLETS BY MOUTH DAILY 15 tablet 0  . SYNTHROID 75 MCG tablet Take 1 tablet by mouth daily.  11   No current facility-administered medications for this visit.      ALLERGIES: Macrobid [nitrofurantoin macrocrystal]  Family History  Problem Relation Age of Onset  . Arthritis Father   . Cancer Father     prostate  . Breast cancer Mother     about age 20  . Breast cancer Paternal Grandmother   . Celiac disease Sister     Social History   Social History  . Marital status: Single    Spouse name: N/A  . Number of children: N/A  . Years of education: N/A   Occupational History  . Not on file.   Social History Main Topics  . Smoking status: Never Smoker  . Smokeless tobacco: Never Used  .  Alcohol use 3.0 oz/week    5 Standard drinks or equivalent per week     Comment: 5 vodkas a week  . Drug use: No  . Sexual activity: No   Other Topics Concern  . Not on file   Social History Narrative   Regular exercise: seldom   Caffeine use: tea daily   Single porig from [place] parents in [place] has lived [place] and  [place]  In [place] for about 2-3 years.    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  PHYSICAL EXAMINATION:    BP 108/60 (BP Location: Right Arm, Patient Position: Sitting, Cuff Size: Normal)   Pulse 88   Ht 5' 3.5" (1.613 m)   Wt 139 lb (63 kg)   LMP 12/03/2011   BMI 24.24 kg/m     General appearance: alert, cooperative and appears stated age  Pelvic: External genitalia:  Vitiligo noted.              Urethra:  normal appearing urethra with no masses, tenderness  or lesions              Bartholins and Skenes: normal                 Vagina: normal appearing vagina with normal color and discharge, no lesions              Cervix: no lesions.  Consistent with LEEP.  Bleeds with pap. Short cervix anteriorly.   Tenaculum to anterior cervical lip and ECC performed.  Tissue to pathology.                 Bimanual Exam:  Uterus:  normal size, contour, position, consistency, mobility, non-tender              Adnexa: no mass, fullness, tenderness             Chaperone was present for exam.  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  Hx LEEP and cryotherapy in past.  Positive HR HPV.  PLAN  Pap only and ECC to [facility].  Final plan to follow.  At a minimum, needs cotesting in one year.  If needs excisional procedure, I recommend a cold knife conization.  Patient aware.    An After Visit Summary was printed and given to the patient.  ___15___ minutes face to face time of which over 50% was spent in counseling.

## 2016-08-10 LAB — CYTOLOGY - PAP

## 2016-08-15 ENCOUNTER — Telehealth: Payer: Self-pay

## 2016-08-15 NOTE — Telephone Encounter (Signed)
Spoke with patient. Advised of message as seen below from [place]. Patient is agreeable and verbalizes understanding. Next annual exam is scheduled for 04/17/2017. 08 recall placed.   Routing to provider for final review. Patient agreeable to disposition. Will close encounter.

## 2016-08-15 NOTE — Telephone Encounter (Signed)
-----   Message from Nunzio Cobbs, MD sent at 08/14/2016  4:27 AM EDT ----- Please report ECC showing LGSIL. Pap was normal.   This confirms what the colposcopy showed in May this year.  The specimen was difficult to grade dysplasia wise at that time, but [facility] lab did favor LGSIL.  The specimens this time were sent to [facility] who agreed with the dx of LGSIL.   I am recommending cotesting at her annual exam in May 2018.  Please place 08 recall for then.   Cc- Marisa Sprinkles

## 2016-12-31 ENCOUNTER — Telehealth: Payer: Self-pay | Admitting: Obstetrics and Gynecology

## 2016-12-31 DIAGNOSIS — Z1211 Encounter for screening for malignant neoplasm of colon: Secondary | ICD-10-CM

## 2016-12-31 NOTE — Telephone Encounter (Signed)
Patient would like a referral for a colonoscopy  

## 2016-12-31 NOTE — Telephone Encounter (Signed)
Per aex note from 04/03/2016 patient's last colonoscopy was in 2007 normal in [place] per patient. Referral placed to Dr.Mann's office for patient to be seen for routine colonoscopy and GI care if needed. Patient has been notified and is agreeable. Aware she will be contacted with appointment date and time. Patient is also asking when her last CBC was done. Advised last CBC with our office was performed on 03/16/2015. Patient verbalizes understanding.  Routing to provider for final review. Patient agreeable to disposition. Will close encounter.

## 2017-01-03 ENCOUNTER — Telehealth: Payer: Self-pay | Admitting: Obstetrics and Gynecology

## 2017-01-03 NOTE — Telephone Encounter (Signed)
Left voicemail regarding referral appointment. The information is listed below. Should the patient need to cancel or reschedule this appointment, please advise them to call the office they've been referred to in order to reschedule.  [facility] [place]  Phone: [phone]  Dr Collene Mares 02-04-17 @ 1:30pm. Please arrive 15 minutes early and bring your insurance card, photo id and list of medications.

## 2017-01-03 NOTE — Telephone Encounter (Signed)
Patient returned call. All information given. Patient understood with no further questions. Ok to close.

## 2017-02-05 ENCOUNTER — Emergency Department (HOSPITAL_COMMUNITY)
Admission: EM | Admit: 2017-02-05 | Discharge: 2017-02-05 | Disposition: A | Discharge status: Home / Self Care | Payer: 59 | Attending: Emergency Medicine | Admitting: Emergency Medicine

## 2017-02-05 ENCOUNTER — Encounter (HOSPITAL_COMMUNITY): Payer: Self-pay

## 2017-02-05 DIAGNOSIS — Y92009 Unspecified place in unspecified non-institutional (private) residence as the place of occurrence of the external cause: Secondary | ICD-10-CM | POA: Insufficient documentation

## 2017-02-05 DIAGNOSIS — W268XXA Contact with other sharp object(s), not elsewhere classified, initial encounter: Secondary | ICD-10-CM | POA: Diagnosis not present

## 2017-02-05 DIAGNOSIS — S61209A Unspecified open wound of unspecified finger without damage to nail, initial encounter: Secondary | ICD-10-CM

## 2017-02-05 DIAGNOSIS — Y9389 Activity, other specified: Secondary | ICD-10-CM | POA: Diagnosis not present

## 2017-02-05 DIAGNOSIS — Y999 Unspecified external cause status: Secondary | ICD-10-CM | POA: Diagnosis not present

## 2017-02-05 DIAGNOSIS — S61205A Unspecified open wound of left ring finger without damage to nail, initial encounter: Secondary | ICD-10-CM | POA: Insufficient documentation

## 2017-02-05 MED ORDER — BACITRACIN ZINC 500 UNIT/GM EX OINT
TOPICAL_OINTMENT | Freq: Once | CUTANEOUS | Status: AC
  Administered 2017-02-05: 1 via TOPICAL
  Filled 2017-02-05: qty 0.9

## 2017-02-05 NOTE — ED Triage Notes (Signed)
Pt here with finger laceration to rt ring finger.  Occurred yesterday.  Cut with knife.

## 2017-02-05 NOTE — ED Provider Notes (Signed)
[facility] DEPT Provider Note   CSN: [id number] Arrival date & time: 02/05/17  0809     History   Chief Complaint Chief Complaint  Patient presents with  . Extremity Laceration    HPI Lori Carney is a 56 y.o. female.  Patient presents with complaint of avulsion to her left ring finger sustained approximate 7 PM yesterday while she was slicing ginger on a mandolin. She cleaned the wound out with water at home. She states it bled for approximately one hour. She continues to have some throbbing pain. She came to the emergency department this morning for further evaluation. No fevers. No numbness or tingling. Finger hurts more with movement of the fingertip. Tetanus is up-to-date.      Past Medical History:  Diagnosis Date  . Anemia   . Arthritis   . Depression    rx at same time as thyroid  on meds for over 20 years had some relpase when tried to go off feels normal on medication  . Fibroid   . Hx of abnormal cervical Pap smear    rx cryo and leep in past  . Osteopenia   . STD (sexually transmitted disease)    HPV  . Thyroid disease    hypothyroidism hashimotos   . Vitamin D deficiency     Patient Active Problem List   Diagnosis Date Noted  . Allergic rhinitis 05/09/2014  . Joint pain 05/09/2014  . Preventive measure 05/09/2014  . Hashimoto's disease 04/06/2013  . Vitiligo 04/06/2013  . FHx: celiac disease 04/06/2013    Past Surgical History:  Procedure Laterality Date  . BREAST SURGERY     cysts removed from Lt and Rt. breasts-benign    OB History    Gravida Para Term Preterm AB Living   2 2 2     2    SAB TAB Ectopic Multiple Live Births                   Home Medications    Prior to Admission medications   Medication Sig Start Date End Date Taking? Authorizing Provider  buPROPion (WELLBUTRIN XL) 150 MG 24 hr tablet Take 1 tablet by mouth daily. 02/23/15   Historical Provider, MD  cholecalciferol (VITAMIN D) 1000 units tablet Take 3,000 Units  by mouth daily.    Historical Provider, MD  clobetasol cream (TEMOVATE) 0.05 %  04/12/16   Historical Provider, MD  sertraline (ZOLOFT) 50 MG tablet TAKE 1 AND 1/2 TABLETS BY MOUTH DAILY 08/02/15   Burnis Medin, MD  SYNTHROID 75 MCG tablet Take 1 tablet by mouth daily. 03/16/15   Historical Provider, MD    Family History Family History  Problem Relation Age of Onset  . Arthritis Father   . Cancer Father     prostate  . Breast cancer Mother     about age 51  . Breast cancer Paternal Grandmother   . Celiac disease Sister     Social History Social History  Substance Use Topics  . Smoking status: Never Smoker  . Smokeless tobacco: Never Used  . Alcohol use 3.0 oz/week    5 Standard drinks or equivalent per week     Comment: 5 vodkas a week     Allergies   Macrobid [nitrofurantoin macrocrystal]   Review of Systems Review of Systems  Constitutional: Negative for fever.  Skin: Positive for wound.  Neurological: Negative for numbness.     Physical Exam Updated Vital Signs BP (!) 125/53 (BP Location: Right  Arm)   Pulse 80   Temp 97.9 F (36.6 C) (Oral)   Resp 18   LMP 12/03/2011   SpO2 99%   Physical Exam  Constitutional: She appears well-developed and well-nourished.  HENT:  Head: Normocephalic and atraumatic.  Mouth/Throat: Oropharynx is clear and moist.  Eyes: Conjunctivae are normal.  Neck: Normal range of motion. Neck supple.  Pulmonary/Chest: No respiratory distress.  Neurological: She is alert.  Skin: Skin is warm and dry.  1 cm avulsion noted to the ulnar aspect of the left ring fingertip extending into the superficial tissues. No suspicion for tendon injury. Patient with full range of motion of hand. Normal cap refill.  Psychiatric: She has a normal mood and affect.  Nursing note and vitals reviewed.    ED Treatments / Results   Procedures Procedures (including critical care time)  Medications Ordered in ED Medications  bacitracin ointment (not  administered)     Initial Impression / Assessment and Plan / ED Course  I have reviewed the triage vital signs and the nursing notes.  Pertinent labs & imaging results that were available during my care of the patient were reviewed by me and considered in my medical decision making (see chart for details).     Patient seen and examined.   Vital signs reviewed and are as follows: BP (!) 125/53 (BP Location: Right Arm)   Pulse 80   Temp 97.9 F (36.6 C) (Oral)   Resp 18   LMP 12/03/2011   SpO2 99%   9:06 AM Pt urged to return with worsening pain, worsening swelling, expanding area of redness or streaking up extremity, fever, or any other concerns. Pt verbalizes understanding and agrees with plan.    Final Clinical Impressions(s) / ED Diagnoses   Final diagnoses:  Avulsion of skin of finger, initial encounter   Patient with minor skin avulsion. No wound closure needed at this time. Tetanus up-to-date. Wound should heal well by secondary intention. Counseled on signs and symptoms of infection and when to return.  New Prescriptions New Prescriptions   No medications on file     Carlisle Cater, Hershal Coria 02/05/17 [place] Yao, MD 02/05/17 1539

## 2017-02-05 NOTE — Discharge Instructions (Signed)
Please read and follow all provided instructions.  Your diagnoses today include:  1. Avulsion of skin of finger, initial encounter    Tests performed today include:  Vital signs. See below for your results today.   Medications prescribed:   None  Take any prescribed medications only as directed.   Home care instructions:  Follow any educational materials and wound care instructions contained in this packet.   Return instructions:  Return to the Emergency Department if you have:  Fever  Worsening pain  Worsening swelling of the wound  Pus draining from the wound  Redness of the skin that moves away from the wound, especially if it streaks away from the affected area   Any other emergent concerns  Your vital signs today were: BP (!) 125/53 (BP Location: Right Arm)    Pulse 80    Temp 97.9 F (36.6 C) (Oral)    Resp 18    LMP 12/03/2011    SpO2 99%  If your blood pressure (BP) was elevated above 135/85 this visit, please have this repeated by your doctor within one month. --------------

## 2017-03-04 ENCOUNTER — Other Ambulatory Visit: Payer: Self-pay | Admitting: Obstetrics and Gynecology

## 2017-03-04 DIAGNOSIS — Z1231 Encounter for screening mammogram for malignant neoplasm of breast: Secondary | ICD-10-CM

## 2017-03-28 ENCOUNTER — Ambulatory Visit
Admission: RE | Admit: 2017-03-28 | Discharge: 2017-03-28 | Disposition: A | Discharge status: Home / Self Care | Payer: 59 | Source: Ambulatory Visit | Attending: Obstetrics and Gynecology | Admitting: Obstetrics and Gynecology

## 2017-03-28 DIAGNOSIS — Z1231 Encounter for screening mammogram for malignant neoplasm of breast: Secondary | ICD-10-CM

## 2017-04-03 NOTE — Progress Notes (Signed)
56 y.o. [id number] Single Caucasian female here for annual exam.    Pap 04/03/16 - ASCUS and positive HR HPV.  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  Satisfactory colposcopy.  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   Pap in 2016 was negative and negative HR HPV.  Pap in 2015 was normal and positive HR HPV and negative subtypes 16 and 18.   Hx of prior LEEP and cryotherapy.  No vaginal bleeding.  Will do her labs with PCP.   PCP:  Rachell Cipro, MD   Patient's last menstrual period was 12/03/2011.           Sexually active: No.  The current method of family planning is post menopausal status.    Exercising: No.   Smoker:  no  Health Maintenance: Pap: 08-08-16 Neg;04-03-16 Ascus:Pos HR HPV;03-16-15 Neg:Neg HR HPV History of abnormal Pap:  Yes, Pap 04/03/16 - ASCUS and positive HR HPV. 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  Pap in 2016 was negative and negative HR HPV.  Pap in 2015 was normal and positive HR HPV and negative subtypes 16 and 18.  Hx LEEP in 2006 or 2007. Hx of cryotherapy. MMG: 03-28-17 Saline Implants/Density C/Neg/BiRads1:TBC Colonoscopy: 03/2017 normal with Dr.Mann;next 03/2027 BMD: 03-24-15  Result: Osteopenia. Spine -2.0, hip -1.7 TDaP:  12/2013 Gardasil:   no HIV: years ago--Neg Hep C: 04-03-16 Neg Screening Labs:  Hb today: PCP, Urine today: not done   reports that she has never smoked. She has never used smokeless tobacco. She reports that she drinks about 3.0 oz of alcohol per week . She reports that she does not use drugs.  Past Medical History:  Diagnosis Date  . Anemia   . Arthritis   . Depression    rx at same time as thyroid  on meds for over 20 years had some relpase when tried to go off feels normal on medication  . Fibroid   . Hx of  abnormal cervical Pap smear    rx cryo and leep in past  . Osteopenia   . STD (sexually transmitted disease)    HPV  . Thyroid disease    hypothyroidism hashimotos   . Vitamin D deficiency     Past Surgical History:  Procedure Laterality Date  . AUGMENTATION MAMMAPLASTY     bilateralrectro pectoral saline 2006  . BREAST SURGERY     cysts removed from Lt and Rt. breasts-benign    Current Outpatient Prescriptions  Medication Sig Dispense Refill  . Ascorbic Acid (VITAMIN C) 1000 MG tablet Take 1,000 mg by mouth daily.    Marland Kitchen buPROPion (WELLBUTRIN XL) 300 MG 24 hr tablet Take 1 tablet by mouth daily.  3  . cholecalciferol (VITAMIN D) 1000 units tablet Take 3,000 Units by mouth daily.    . ferrous sulfate 325 (65 FE) MG EC tablet Take 325 mg by mouth daily.    . sertraline (ZOLOFT) 50 MG tablet TAKE 1 AND 1/2 TABLETS BY MOUTH DAILY 15 tablet 0  . SYNTHROID 150 MCG tablet Take 1 tablet by mouth daily.     No current facility-administered medications for this visit.     Family History  Problem Relation Age of Onset  . Arthritis Father   . Cancer Father     prostate  . Breast cancer  Mother     about age 27  . Breast cancer Paternal Grandmother   . Celiac disease Sister     ROS:  Pertinent items are noted in HPI.  Otherwise, a comprehensive ROS was negative.  Exam:   BP (!) 106/58 (BP Location: Right Arm, Patient Position: Sitting, Cuff Size: Normal)   Pulse 70   Resp 20   Ht 5\' 4"  (1.626 m)   Wt 141 lb 12.8 oz (64.3 kg)   LMP 12/03/2011   BMI 24.34 kg/m     General appearance: alert, cooperative and appears stated age Head: Normocephalic, without obvious abnormality, atraumatic Neck: no adenopathy, supple, symmetrical, trachea midline and thyroid normal to inspection and palpation Lungs: clear to auscultation bilaterally Breasts: consistent with implants, normal appearance, no masses or tenderness, No nipple retraction or dimpling, No nipple discharge or bleeding, No  axillary or supraclavicular adenopathy Heart: regular rate and rhythm Abdomen: soft, non-tender; no masses, no organomegaly Extremities: extremities normal, atraumatic, no cyanosis or edema Skin: Skin color, texture, turgor normal. No rashes or lesions Lymph nodes: Cervical, supraclavicular, and axillary nodes normal. No abnormal inguinal nodes palpated Neurologic: Grossly normal  Pelvic: External genitalia:  no lesions              Urethra:  normal appearing urethra with no masses, tenderness or lesions.  Vitiligo.  Hyperpigmentation of right medial labia minora.  Appears as a long 3 cm x 5 mm vertical line.               Bartholins and Skenes: normal                 Vagina: normal appearing vagina with normal color and discharge, no lesions              Cervix: no lesions              Pap taken: Yes.   Bimanual Exam:  Uterus:  normal size, contour, position, consistency, mobility, non-tender              Adnexa: no mass, fullness, tenderness              Rectal exam: Yes.  .  Confirms.              Anus:  normal sphincter tone, no lesions  Chaperone was present for exam.  Assessment:   Well woman visit with normal exam. Hx LGSIL.  Status post prior LEEP and cryo. Hyperpigmentation line of the right medial labia majora.  Osteopenia.  Bilateral breast implants. FH of breast cancer.   Plan: Mammogram screening discussed. Recommended self breast awareness. Pap and HR HPV as above. Guidelines for Calcium, Vitamin D, regular exercise program including cardiovascular and weight bearing exercise. Return for vulvar biopsy. Order for BMD at [facility]. Follow up annually and prn.   After visit summary provided.

## 2017-04-04 ENCOUNTER — Ambulatory Visit (INDEPENDENT_AMBULATORY_CARE_PROVIDER_SITE_OTHER): Payer: 59 | Admitting: Obstetrics and Gynecology

## 2017-04-04 ENCOUNTER — Encounter: Payer: Self-pay | Admitting: Obstetrics and Gynecology

## 2017-04-04 VITALS — BP 106/58 | HR 70 | Resp 20 | Ht 64.0 in | Wt 141.8 lb

## 2017-04-04 DIAGNOSIS — M8580 Other specified disorders of bone density and structure, unspecified site: Secondary | ICD-10-CM

## 2017-04-04 DIAGNOSIS — Z01419 Encounter for gynecological examination (general) (routine) without abnormal findings: Secondary | ICD-10-CM

## 2017-04-04 DIAGNOSIS — L819 Disorder of pigmentation, unspecified: Secondary | ICD-10-CM

## 2017-04-04 NOTE — Patient Instructions (Signed)

## 2017-04-09 ENCOUNTER — Telehealth: Payer: Self-pay | Admitting: Obstetrics and Gynecology

## 2017-04-09 LAB — IPS PAP TEST WITH HPV

## 2017-04-09 NOTE — Telephone Encounter (Signed)
Called patient to review benefits for procedure. Left voicemail to call back and review. °

## 2017-04-10 NOTE — Telephone Encounter (Signed)
Patient returning call. Is in [place] and will not be back until the end of next week. Will call back then.

## 2017-04-10 NOTE — Telephone Encounter (Signed)
Per previous phone message, noted in the referral that patient is in [place] and will follow up for scheduling, upon her return.   Routing to Dr Quincy Simmonds  cc: Theresia Lo

## 2017-04-10 NOTE — Telephone Encounter (Signed)
Thank you for following through to help schedule the patient's vulvar biopsy.

## 2017-04-11 NOTE — Telephone Encounter (Signed)
Will note follow up in patients biopsy order. Ok to close phone note.

## 2017-04-17 ENCOUNTER — Ambulatory Visit: Payer: PRIVATE HEALTH INSURANCE | Admitting: Obstetrics and Gynecology

## 2017-04-29 ENCOUNTER — Telehealth: Payer: Self-pay

## 2017-04-29 NOTE — Telephone Encounter (Signed)
Spoke with patient. Phone call passed from [facility]. Patient would like to know if she can return to work after having her vulvar biopsy. Advised the area will likely be tender, but that she is able to return to work after. Patient verbalizes understanding. Appointment scheduled for 04/30/2017 at 3 pm with Dr.Silva. Patient is agreeable to date and time.  Routing to provider for final review. Patient agreeable to disposition. Will close encounter.

## 2017-04-30 ENCOUNTER — Encounter: Payer: Self-pay | Admitting: Obstetrics and Gynecology

## 2017-04-30 ENCOUNTER — Ambulatory Visit (INDEPENDENT_AMBULATORY_CARE_PROVIDER_SITE_OTHER): Payer: 59 | Admitting: Obstetrics and Gynecology

## 2017-04-30 DIAGNOSIS — L819 Disorder of pigmentation, unspecified: Secondary | ICD-10-CM

## 2017-04-30 NOTE — Patient Instructions (Signed)

## 2017-04-30 NOTE — Progress Notes (Signed)
Subjective:     Patient ID: Lori Carney, female   DOB: 1961-07-10, 56 y.o.   MRN: [id number]  HPI  Patient here today for vulvar biopsy of right medial labia minora due to hyperpigmentation. This area appears as a long 3cm x 65mm vertical line.  Hx vitiligo. States the pigmentation pattern of her skin changes constantly.  Had a great time in [place].   Review of Systems  LMP: 2013 Contraception:Postmenopausal     Objective:   Physical Exam     Vulvar biopsy - right labia minora.  Consent for procedure.  Prep with betadine.  1% lidocaine - Lot [id number], exp 02/21. 3 mm punch biopsy to pathology.  AgNO3 placed.  Good hemostasis.  Minimal EBL.  No complications.   Assessment:     Hyperpigmentation of the right labia minora.     Plan:     Fu biopsy.  Instructions and precautions given.   After visit summary to patient.

## 2017-05-07 ENCOUNTER — Telehealth: Payer: Self-pay | Admitting: Obstetrics and Gynecology

## 2017-05-07 NOTE — Telephone Encounter (Signed)
Patient requesting results from her biopsy

## 2017-05-07 NOTE — Telephone Encounter (Signed)
Biopsy is benign and shows vulvar melanosis, which is hyperpigmentation.  No further evaluation or treatment is needed.

## 2017-05-07 NOTE — Telephone Encounter (Signed)
Routing to Dr.Silva for review of pathology results from 04/30/2017.

## 2017-05-07 NOTE — Telephone Encounter (Signed)
Spoke with patient. Advised of results as seen below from Dr.Silva. Patient is agreeable and verbalizes understanding.  Routing to provider for final review. Patient agreeable to disposition. Will close encounter.     

## 2018-01-27 NOTE — Progress Notes (Signed)
[facility] [address], [place] [address] Phone: [phone] Subjective:    I'm seeing this patient by the request  of:  Fanny Bien, MD   CC: Shoulder pain  GYI:[id number]  Lori Carney is a 57 y.o. female coming in with complaint of left shoulder pain. Pain radiates to the forearm.   Onset- beginning of Jan. Location- Anterior Character- Sharp Aggravating factors- flexion, abduction Reliving factors-  Therapies tried- Ice, heat, topics Severity-7/10      Past Medical History:  Diagnosis Date  . Anemia   . Arthritis   . Depression    rx at same time as thyroid  on meds for over 20 years had some relpase when tried to go off feels normal on medication  . Fibroid   . Hx of abnormal cervical Pap smear    rx cryo and leep in past  . Osteopenia   . STD (sexually transmitted disease)    HPV  . Thyroid disease    hypothyroidism hashimotos   . Vitamin D deficiency    Past Surgical History:  Procedure Laterality Date  . AUGMENTATION MAMMAPLASTY     bilateralrectro pectoral saline 2006  . BREAST SURGERY     cysts removed from Lt and Rt. breasts-benign   Social History   Socioeconomic History  . Marital status: Single    Spouse name: None  . Number of children: None  . Years of education: None  . Highest education level: None  Social Needs  . Financial resource strain: None  . Food insecurity - worry: None  . Food insecurity - inability: None  . Transportation needs - medical: None  . Transportation needs - non-medical: None  Occupational History  . None  Tobacco Use  . Smoking status: Never Smoker  . Smokeless tobacco: Never Used  Substance and Sexual Activity  . Alcohol use: Yes    Alcohol/week: 3.0 oz    Types: 5 Standard drinks or equivalent per week    Comment: 5 vodkas a week  . Drug use: No  . Sexual activity: Not Currently    Partners: Male    Birth control/protection: Post-menopausal  Other  Topics Concern  . None  Social History Narrative   Regular exercise: seldom   Caffeine use: tea daily   Single porig from [place] parents in [place] has lived [place] and  [place]  In [place] for about 2-3 years.    6 hours of sleep per nights   Lives alone/no pets   Single    G2P2 children grown and healthy   BA degrees portfolio manager  [employer]         Allergies  Allergen Reactions  . Macrobid [Nitrofurantoin Macrocrystal] Nausea And Vomiting   Family History  Problem Relation Age of Onset  . Arthritis Father   . Cancer Father        prostate  . Breast cancer Mother        about age 48  . Breast cancer Paternal Grandmother   . Celiac disease Sister      Past medical history, social, surgical and family history all reviewed in electronic medical record.  No pertanent information unless stated regarding to the chief complaint.   Review of Systems:Review of systems updated and as accurate as of 01/28/18  No headache, visual changes, nausea, vomiting, diarrhea, constipation, dizziness, abdominal pain, skin rash, fevers, chills, night sweats, weight loss, swollen lymph nodes, body aches, joint swelling, muscle aches,  chest pain, shortness of breath, mood changes.   Objective  Blood pressure 118/80, pulse 66, height 5\' 4"  (1.626 m), weight 143 lb (64.9 kg), last menstrual period 12/03/2011, SpO2 99 %. Systems examined below as of 01/28/18   General: No apparent distress alert and oriented x3 mood and affect normal, dressed appropriately.  HEENT: Pupils equal, extraocular movements intact  Respiratory: Patient's speak in full sentences and does not appear short of breath  Cardiovascular: No lower extremity edema, non tender, no erythema  Skin: Warm dry intact with no signs of infection or rash on extremities or on axial skeleton.  Abdomen: Soft nontender  Neuro: Cranial nerves II through XII are intact, neurovascularly intact in all extremities with 2+ DTRs and 2+ pulses.  Lymph:  No lymphadenopathy of posterior or anterior cervical chain or axillae bilaterally.  Gait normal with good balance and coordination.  MSK:  Non tender with full range of motion and good stability and symmetric strength and tone of  elbows, wrist, hip, knee and ankles bilaterally.  Shoulder: Left Inspection reveals no abnormalities, atrophy or asymmetry. Palpation is normal with no tenderness over AC joint or bicipital groove. ROM is full in all planes passively. Rotator cuff strength normal throughout. signs of impingement with positive Neer and Hawkin's tests, but negative empty can sign. Speeds and Yergason's tests normal. No labral pathology noted with negative Obrien's, negative clunk and good stability. Normal scapular function observed. No painful arc and no drop arm sign. No apprehension sign  MSK US performed of: left  This study was ordered, performed, and interpreted by Charlann Boxer D.O.  Shoulder:   Supraspinatus:  Appears normal on long and transverse views, Bursal bulge seen with calcific changes shoulder abduction on impingement view. ISubscapularis:  Appears normal on long and transverse views. Positive bursa calcific changes as well Teres Minor:  Appears normal on long and transverse views. AC joint: Moderate arthritis Glenohumeral Joint:  Appears normal without effusion. Glenoid Labrum:  Intact without visualized tears. Biceps Tendon:  Appears normal on long and transverse views, no fraying of tendon, tendon located in intertubercular groove, no subluxation with shoulder internal or external rotation.  Impression: Subacromial bursitis calcific changes    97110; 15 additional minutes spent for Therapeutic exercises as stated in above notes.  This included exercises focusing on stretching, strengthening, with significant focus on eccentric aspects.   Long term goals include an improvement in range of motion, strength, endurance as well as avoiding reinjury. Patient's  frequency would include in 1-2 times a day, 3-5 times a week for a duration of 6-12 weeks.  Shoulder Exercises that included:  Basic scapular stabilization to include adduction and depression of scapula Scaption, focusing on proper movement and good control Internal and External rotation utilizing a theraband, with elbow tucked at side entire time Rows with theraband which was given  Proper technique shown and discussed handout in great detail with ATC.  All questions were discussed and answered.      Impression and Recommendations:     This case required medical decision making of moderate complexity.      Note: This dictation was prepared with Dragon dictation along with smaller phrase technology. Any transcriptional errors that result from this process are unintentional.

## 2018-01-28 ENCOUNTER — Encounter: Payer: Self-pay | Admitting: Family Medicine

## 2018-01-28 ENCOUNTER — Ambulatory Visit (INDEPENDENT_AMBULATORY_CARE_PROVIDER_SITE_OTHER): Payer: 59 | Admitting: Family Medicine

## 2018-01-28 ENCOUNTER — Other Ambulatory Visit: Payer: Self-pay

## 2018-01-28 ENCOUNTER — Ambulatory Visit: Payer: Self-pay

## 2018-01-28 VITALS — BP 118/80 | HR 66 | Ht 64.0 in | Wt 143.0 lb

## 2018-01-28 DIAGNOSIS — G8929 Other chronic pain: Secondary | ICD-10-CM

## 2018-01-28 DIAGNOSIS — M7530 Calcific tendinitis of unspecified shoulder: Secondary | ICD-10-CM | POA: Diagnosis not present

## 2018-01-28 DIAGNOSIS — M25512 Pain in left shoulder: Secondary | ICD-10-CM | POA: Diagnosis not present

## 2018-01-28 MED ORDER — VITAMIN D (ERGOCALCIFEROL) 1.25 MG (50000 UNIT) PO CAPS: 50000.0000 [IU] | ORAL_CAPSULE | ORAL | 0 refills | Status: DC

## 2018-01-28 MED ORDER — DICLOFENAC SODIUM 2 % TD SOLN: 2.0000 g | Freq: Two times a day (BID) | TRANSDERMAL | 3 refills | Status: DC

## 2018-01-28 NOTE — Patient Instructions (Addendum)
Good to see you  Calcific bursitis.  Once weekly vitamin D for 12 weeks . pennsaid pinkie amount topically 2 times daily as needed.   Keep hands within peripheral vision  Arm compression sleeve with working out.  See me again in 4 weeks

## 2018-01-28 NOTE — Assessment & Plan Note (Signed)
Calcific changes of the bursa noted.  Blunting but this is what is contributing to most of her aches and pains.  Discussed icing regimen, home exercise, which activities to do which wants to avoid.  Once weekly vitamin D given.  Discussed avoiding certain activities.  Follow-up again in 4 weeks

## 2018-03-02 ENCOUNTER — Ambulatory Visit: Payer: 59 | Admitting: Family Medicine

## 2018-04-08 ENCOUNTER — Encounter: Payer: Self-pay | Admitting: Obstetrics and Gynecology

## 2018-04-08 ENCOUNTER — Other Ambulatory Visit (HOSPITAL_COMMUNITY)
Admission: RE | Admit: 2018-04-08 | Discharge: 2018-04-08 | Disposition: A | Discharge status: Home / Self Care | Payer: 59 | Source: Ambulatory Visit | Attending: Obstetrics and Gynecology | Admitting: Obstetrics and Gynecology

## 2018-04-08 ENCOUNTER — Ambulatory Visit (INDEPENDENT_AMBULATORY_CARE_PROVIDER_SITE_OTHER): Payer: 59 | Admitting: Obstetrics and Gynecology

## 2018-04-08 ENCOUNTER — Other Ambulatory Visit: Payer: Self-pay

## 2018-04-08 VITALS — BP 110/60 | HR 72 | Resp 14 | Ht 63.75 in | Wt 144.0 lb

## 2018-04-08 DIAGNOSIS — Z01419 Encounter for gynecological examination (general) (routine) without abnormal findings: Secondary | ICD-10-CM

## 2018-04-08 NOTE — Progress Notes (Signed)
57 y.o. [id number] Single Caucasian female here for annual exam.    No bleeding.   Trouble sleeping.  Takes Wellbutrin and Zoloft.   PCP: Dr. Willette Pa    Patient's last menstrual period was 12/03/2011.           Sexually active: No.  The current method of family planning is post menopausal status.    Exercising: No.  The patient does not participate in regular exercise at present. Smoker:  no  Health Maintenance: Pap:  04/07/17 Neg:Neg HR HPV History of abnormal Pap:  Yes, Pap 04/03/16 - ASCUS and positive HR HPV. 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 Pap in 2016 was negative and negative HR HPV.  Pap in 2015 was normal and positive HR HPV and negative subtypes 16 and 18.  Hx LEEP in 2006 or 2007. Hx of cryotherapy. MMG:  03/28/17 BIRADS 1 negative/density c Colonoscopy:  4/2018normal with Dr.Mann;next 03/2027 BMD:   03/23/15  Result  Osteopenia. Spine -2.0, hip -1.7 TDaP:  12/2013 Gardasil:   n/a HIV: negative in the past Hep C: 04/03/16 Negative Screening Labs:  PCP   reports that she has never smoked. She has never used smokeless tobacco. She reports that she drinks about 3.0 oz of alcohol per week. She reports that she does not use drugs.  Past Medical History:  Diagnosis Date  . Anemia   . Arthritis   . Depression    rx at same time as thyroid  on meds for over 20 years had some relpase when tried to go off feels normal on medication  . Fibroid   . Hx of abnormal cervical Pap smear    rx cryo and leep in past  . Osteopenia   . STD (sexually transmitted disease)    HPV  . Thyroid disease    hypothyroidism hashimotos   . Vitamin D deficiency     Past Surgical History:  Procedure Laterality Date  . AUGMENTATION MAMMAPLASTY     bilateralrectro pectoral saline 2006  . BREAST SURGERY     cysts removed from Lt and Rt. breasts-benign    Current  Outpatient Medications  Medication Sig Dispense Refill  . Ascorbic Acid (VITAMIN C) 1000 MG tablet Take 1,000 mg by mouth daily.    Marland Kitchen buPROPion (WELLBUTRIN XL) 300 MG 24 hr tablet Take 1 tablet by mouth daily.  3  . cholecalciferol (VITAMIN D) 1000 units tablet Take 3,000 Units by mouth daily.    . ferrous sulfate 325 (65 FE) MG EC tablet Take 325 mg by mouth daily.    . sertraline (ZOLOFT) 50 MG tablet TAKE 1 AND 1/2 TABLETS BY MOUTH DAILY 15 tablet 0  . SYNTHROID 150 MCG tablet Take 1 tablet by mouth daily.     No current facility-administered medications for this visit.     Family History  Problem Relation Age of Onset  . Arthritis Father   . Cancer Father        prostate  . Breast cancer Mother        about age 54  . Breast cancer Paternal Grandmother   . Celiac disease Sister     Review of Systems  Constitutional: Negative.   HENT: Negative.   Eyes: Negative.   Respiratory: Negative.   Cardiovascular: Negative.   Gastrointestinal: Negative.   Endocrine: Negative.   Genitourinary: Negative.   Musculoskeletal: Negative.  Skin: Negative.   Allergic/Immunologic: Negative.   Neurological: Negative.   Hematological: Negative.   Psychiatric/Behavioral: Negative.     Exam:   BP 110/60 (BP Location: Right Arm, Patient Position: Sitting, Cuff Size: Normal)   Pulse 72   Resp 14   Ht 5' 3.75" (1.619 m)   Wt 144 lb (65.3 kg)   LMP 12/03/2011   BMI 24.91 kg/m     General appearance: alert, cooperative and appears stated age Head: Normocephalic, without obvious abnormality, atraumatic Neck: no adenopathy, supple, symmetrical, trachea midline and thyroid normal to inspection and palpation Lungs: clear to auscultation bilaterally Breasts: bilateral implants, no masses or tenderness, No nipple retraction or dimpling, No nipple discharge or bleeding, No axillary or supraclavicular adenopathy Heart: regular rate and rhythm Abdomen: soft, non-tender; no masses, no  organomegaly Extremities: extremities normal, atraumatic, no cyanosis or edema Skin: Skin color, texture, turgor normal.  Hypopigmentation consistent with vitiligo. Lymph nodes: Cervical, supraclavicular, and axillary nodes normal. No abnormal inguinal nodes palpated Neurologic: Grossly normal  Pelvic: External genitalia:   Hyperpigmentation of the right medial labia minora.               Urethra:  normal appearing urethra with no masses, tenderness or lesions              Bartholins and Skenes: normal                 Vagina: normal appearing vagina with normal color and discharge, no lesions              Cervix: no lesions              Pap taken: Yes.   Bimanual Exam:  Uterus:  normal size, contour, position, consistency, mobility, non-tender              Adnexa: no mass, fullness, tenderness              Rectal exam: Yes.  .  Confirms.              Anus:  normal sphincter tone, no lesions  Chaperone was present for exam.  Assessment:   Well woman visit with normal exam. Hx LGSIL. Hx LEEP and cryo. Hyperpigmentation of the right medial labia minora.  Vulvar melanosis.  Bilateral breast implants. FH breast cancer.  Osteopenia.   Plan: Mammogram screening. Recommended self breast awareness. Pap and HR HPV as above. Guidelines for Calcium, Vitamin D, regular exercise program including cardiovascular and weight bearing exercise. Labs with PCP.  Declines BMD this year and will consider for next year.  Follow up annually and prn.   After visit summary provided.

## 2018-04-08 NOTE — Patient Instructions (Signed)

## 2018-04-10 LAB — CYTOLOGY - PAP
Diagnosis: NEGATIVE
HPV: NOT DETECTED

## 2018-07-01 ENCOUNTER — Other Ambulatory Visit: Payer: Self-pay | Admitting: Obstetrics and Gynecology

## 2018-07-01 DIAGNOSIS — Z1231 Encounter for screening mammogram for malignant neoplasm of breast: Secondary | ICD-10-CM

## 2018-07-24 ENCOUNTER — Ambulatory Visit
Admission: RE | Admit: 2018-07-24 | Discharge: 2018-07-24 | Disposition: A | Discharge status: Home / Self Care | Payer: 59 | Source: Ambulatory Visit | Attending: Obstetrics and Gynecology | Admitting: Obstetrics and Gynecology

## 2018-07-24 DIAGNOSIS — Z1231 Encounter for screening mammogram for malignant neoplasm of breast: Secondary | ICD-10-CM

## 2018-10-08 ENCOUNTER — Ambulatory Visit (INDEPENDENT_AMBULATORY_CARE_PROVIDER_SITE_OTHER): Payer: 59 | Admitting: Podiatry

## 2018-10-08 ENCOUNTER — Ambulatory Visit (INDEPENDENT_AMBULATORY_CARE_PROVIDER_SITE_OTHER): Payer: 59

## 2018-10-08 DIAGNOSIS — M2041 Other hammer toe(s) (acquired), right foot: Secondary | ICD-10-CM

## 2018-10-08 DIAGNOSIS — M216X1 Other acquired deformities of right foot: Secondary | ICD-10-CM | POA: Diagnosis not present

## 2018-10-08 NOTE — Patient Instructions (Signed)
Pre-Operative Instructions  Congratulations, you have decided to take an important step towards improving your quality of life.  You can be assured that the doctors and staff at [facility] will be with you every step of the way.  Here are some important things you should know:  1. Plan to be at the surgery center/hospital at least 1 (one) hour prior to your scheduled time, unless otherwise directed by the surgical center/hospital staff.  You must have a responsible adult accompany you, remain during the surgery and drive you home.  Make sure you have directions to the surgical center/hospital to ensure you arrive on time. 2. If you are having surgery at [facility] or [facility] hospitals, you will need a copy of your medical history and physical form from your family physician within one month prior to the date of surgery. We will give you a form for your primary physician to complete.  3. We make every effort to accommodate the date you request for surgery.  However, there are times where surgery dates or times have to be moved.  We will contact you as soon as possible if a change in schedule is required.   4. No aspirin/ibuprofen for one week before surgery.  If you are on aspirin, any non-steroidal anti-inflammatory medications (Mobic, Aleve, Ibuprofen) should not be taken seven (7) days prior to your surgery.  You make take Tylenol for pain prior to surgery.  5. Medications - If you are taking daily heart and blood pressure medications, seizure, reflux, allergy, asthma, anxiety, pain or diabetes medications, make sure you notify the surgery center/hospital before the day of surgery so they can tell you which medications you should take or avoid the day of surgery. 6. No food or drink after midnight the night before surgery unless directed otherwise by surgical center/hospital staff. 7. No alcoholic beverages 24-hours prior to surgery.  No smoking 24-hours prior or 24-hours after  surgery. 8. Wear loose pants or shorts. They should be loose enough to fit over bandages, boots, and casts. 9. Don't wear slip-on shoes. Sneakers are preferred. 10. Bring your boot with you to the surgery center/hospital.  Also bring crutches or a walker if your physician has prescribed it for you.  If you do not have this equipment, it will be provided for you after surgery. 11. If you have not been contacted by the surgery center/hospital by the day before your surgery, call to confirm the date and time of your surgery. 12. Leave-time from work may vary depending on the type of surgery you have.  Appropriate arrangements should be made prior to surgery with your employer. 13. Prescriptions will be provided immediately following surgery by your doctor.  Fill these as soon as possible after surgery and take the medication as directed. Pain medications will not be refilled on weekends and must be approved by the doctor. 14. Remove nail polish on the operative foot and avoid getting pedicures prior to surgery. 15. Wash the night before surgery.  The night before surgery wash the foot and leg well with water and the antibacterial soap provided. Be sure to pay special attention to beneath the toenails and in between the toes.  Wash for at least three (3) minutes. Rinse thoroughly with water and dry well with a towel.  Perform this wash unless told not to do so by your physician.  Enclosed: 1 Ice pack (please put in freezer the night before surgery)   1 Hibiclens skin cleaner     Pre-op instructions  If you have any questions regarding the instructions, please do not hesitate to call our office.  [place]: [address], [place], [place] [address] -- [phone]  [place]: [address] , [place] [address] -- [phone]  [place]: [address]  [place], [place] [address] -- [phone]  [place]: [address], [address], [place], [place] [address] -- [phone]  Website:  [email or web address] 

## 2018-10-08 NOTE — Progress Notes (Signed)
Subjective:  Patient ID: Lori Carney, female    DOB: 1961/07/13,  MRN: [id number] HPI Chief Complaint  Patient presents with  . Foot Problem    Right foot 2nd toe hammertoe duration 6 years pt states pain when wearing shoes.    57 y.o. female presents with the above complaint.   ROS: She denies fever chills nausea vomiting muscle aches pains calf pain back pain chest pain shortness of breath.  Past Medical History:  Diagnosis Date  . Anemia   . Arthritis   . Depression    rx at same time as thyroid  on meds for over 20 years had some relpase when tried to go off feels normal on medication  . Fibroid   . Hx of abnormal cervical Pap smear    rx cryo and leep in past  . Osteopenia   . STD (sexually transmitted disease)    HPV  . Thyroid disease    hypothyroidism hashimotos   . Vitamin D deficiency    Past Surgical History:  Procedure Laterality Date  . AUGMENTATION MAMMAPLASTY     bilateralrectro pectoral saline 2006  . BREAST SURGERY     cysts removed from Lt and Rt. breasts-benign    Current Outpatient Medications:  .  Ascorbic Acid (VITAMIN C) 1000 MG tablet, Take 1,000 mg by mouth daily., Disp: , Rfl:  .  buPROPion (WELLBUTRIN XL) 300 MG 24 hr tablet, Take 1 tablet by mouth daily., Disp: , Rfl: 3 .  cholecalciferol (VITAMIN D) 1000 units tablet, Take 3,000 Units by mouth daily., Disp: , Rfl:  .  ferrous sulfate 325 (65 FE) MG EC tablet, Take 325 mg by mouth daily., Disp: , Rfl:  .  hydrOXYzine (ATARAX/VISTARIL) 25 MG tablet, , Disp: , Rfl:  .  montelukast (SINGULAIR) 10 MG tablet, , Disp: , Rfl:  .  predniSONE (DELTASONE) 10 MG tablet, , Disp: , Rfl:  .  sertraline (ZOLOFT) 50 MG tablet, TAKE 1 AND 1/2 TABLETS BY MOUTH DAILY, Disp: 15 tablet, Rfl: 0 .  SYNTHROID 150 MCG tablet, Take 1 tablet by mouth daily., Disp: , Rfl:   Allergies  Allergen Reactions  . Macrobid [Nitrofurantoin Macrocrystal] Nausea And Vomiting   Review of Systems Objective:  There were no  vitals filed for this visit.  General: Well developed, nourished, in no acute distress, alert and oriented x3   Dermatological: Skin is warm, dry and supple bilateral. Nails x 10 are well maintained; remaining integument appears unremarkable at this time. There are no open sores, no preulcerative lesions, no rash or signs of infection present.  Vascular: Dorsalis Pedis artery and Posterior Tibial artery pedal pulses are 2/4 bilateral with immedate capillary fill time. Pedal hair growth present. No varicosities and no lower extremity edema present bilateral.   Neruologic: Grossly intact via light touch bilateral. Vibratory intact via tuning fork bilateral. Protective threshold with Semmes Wienstein monofilament intact to all pedal sites bilateral. Patellar and Achilles deep tendon reflexes 2+ bilateral. No Babinski or clonus noted bilateral.   Musculoskeletal: No gross boney pedal deformities bilateral. No pain, crepitus, or limitation noted with foot and ankle range of motion bilateral. Muscular strength 5/5 in all groups tested bilateral.  Pain on palpation and range of motion of the second metatarsophalangeal joint of the right foot.  Rigid hammertoe deformity not reaching 180 degrees at the PIPJ second digit right foot.  Not able to completely lower the toe past neutral at the level of the metatarsal phalangeal joint.  Gait:  Unassisted, Nonantalgic.    Radiographs:  Radiographs taken today demonstrate an osseously mature individual with elongated second and third metatarsals and hammertoe deformity second digit right foot appears to be rigid in nature.  No other major osseous abnormalities.  Assessment & Plan:   Assessment: Plantarflexed second metatarsal capsulitis of the second metatarsophalangeal joint hammertoe deformity second digit right foot.  Plan: Discussed etiology pathology conservative surgical therapies this point consented her for a second metatarsal osteotomy with screw  fixation and hammertoe repair with screw fixation right foot.  Answered all the questions regarding this procedure the best my ability in layman's terms she understood it was amenable to it signed all 3 pages of the consent form.  Discussed the possible postop complications which may include but are not limited to postop pain bleeding swelling infection recurrence need for further surgery.  Dispensed information regarding preop the surgery center and anesthesia group.  Dispensed a Cam walker I will follow-up with her in January for surgery.     Ely Ballen T. [place], [place]

## 2018-12-09 ENCOUNTER — Other Ambulatory Visit: Payer: Self-pay | Admitting: Podiatry

## 2018-12-09 MED ORDER — ONDANSETRON HCL 4 MG PO TABS: 4.0000 mg | ORAL_TABLET | Freq: Three times a day (TID) | ORAL | 0 refills | Status: DC | PRN

## 2018-12-09 MED ORDER — CEPHALEXIN 500 MG PO CAPS: 500.0000 mg | ORAL_CAPSULE | Freq: Three times a day (TID) | ORAL | 0 refills | Status: DC

## 2018-12-09 MED ORDER — OXYCODONE-ACETAMINOPHEN 10-325 MG PO TABS: 1.0000 | ORAL_TABLET | Freq: Four times a day (QID) | ORAL | 0 refills | Status: AC | PRN

## 2018-12-11 ENCOUNTER — Encounter: Payer: Self-pay | Admitting: Podiatry

## 2018-12-11 DIAGNOSIS — M2041 Other hammer toe(s) (acquired), right foot: Secondary | ICD-10-CM

## 2018-12-11 DIAGNOSIS — M21541 Acquired clubfoot, right foot: Secondary | ICD-10-CM

## 2018-12-17 ENCOUNTER — Ambulatory Visit (INDEPENDENT_AMBULATORY_CARE_PROVIDER_SITE_OTHER): Payer: 59

## 2018-12-17 ENCOUNTER — Ambulatory Visit (INDEPENDENT_AMBULATORY_CARE_PROVIDER_SITE_OTHER): Payer: Self-pay | Admitting: Podiatry

## 2018-12-17 DIAGNOSIS — M216X1 Other acquired deformities of right foot: Secondary | ICD-10-CM | POA: Diagnosis not present

## 2018-12-17 DIAGNOSIS — M2041 Other hammer toe(s) (acquired), right foot: Secondary | ICD-10-CM | POA: Diagnosis not present

## 2018-12-17 NOTE — Progress Notes (Signed)
She presents today date of surgery December 11, 2018 status post second tarsal osteotomy hammertoe repair second.  States that left foot has been feeling okay it throbs at times but other than that of fine.  Denies fever chills nausea vomiting muscle aches pains calf pain back pain chest pain shortness of breath.  Objective: Presents today in her Cam boot once removed demonstrates dry sterile dressing and syntax.  Once removed demonstrates sutures are intact margins well coapted toe is sitting rectus no signs of infection radiograph confirms capital osteotomy is intact with K wire.  Assessment: Well-healed surgical toe and foot left.  Plan: Redressed the right foot today and I will follow-up with her in 1 week.

## 2018-12-24 ENCOUNTER — Ambulatory Visit (INDEPENDENT_AMBULATORY_CARE_PROVIDER_SITE_OTHER): Payer: 59 | Admitting: Podiatry

## 2018-12-24 DIAGNOSIS — Z09 Encounter for follow-up examination after completed treatment for conditions other than malignant neoplasm: Secondary | ICD-10-CM

## 2018-12-24 DIAGNOSIS — M216X1 Other acquired deformities of right foot: Secondary | ICD-10-CM

## 2018-12-24 DIAGNOSIS — M2041 Other hammer toe(s) (acquired), right foot: Secondary | ICD-10-CM

## 2018-12-24 NOTE — Progress Notes (Signed)
She presents today date of surgery December 11, 2018 status post metatarsal osteotomy second with hammertoe repair second right states my foot feels fine no pain just numbness.  Objective: Vital signs are stable alert and oriented x3.  Pulses are palpable.  There is no erythema just mild edema no cellulitis drainage or odor sutures are intact margins well coapted sutures removed today margins remain well coapted toe is nice and rectus mildly elevated at the metatarsal phalangeal joint.  Assessment: Well-healing surgical foot 2 weeks out now.  Plan: Sutures were removed today placed her in a plantarflexed 3 brace for the toe and then she will continue the Darco shoe and a compression anklet follow-up with her in 2 weeks.  We will allow her to start getting this wet at this point.

## 2019-01-07 ENCOUNTER — Ambulatory Visit: Payer: 59

## 2019-01-07 ENCOUNTER — Ambulatory Visit (INDEPENDENT_AMBULATORY_CARE_PROVIDER_SITE_OTHER): Payer: 59 | Admitting: Podiatry

## 2019-01-07 DIAGNOSIS — M216X1 Other acquired deformities of right foot: Secondary | ICD-10-CM

## 2019-01-07 DIAGNOSIS — Z09 Encounter for follow-up examination after completed treatment for conditions other than malignant neoplasm: Secondary | ICD-10-CM

## 2019-01-07 DIAGNOSIS — M2041 Other hammer toe(s) (acquired), right foot: Secondary | ICD-10-CM

## 2019-01-07 NOTE — Progress Notes (Signed)
She presents today date of surgery December 11, 2018 status post metatarsal osteotomy second right with hammertoe repair second right.  States my toe feels fine but cannot try to walk it really sort of hurts and therefore the toe cannot touch the floor.  Objective: Vital signs are stable alert oriented x3.  There is mild edema to the plantar aspect of the foot incision site is noted to heal uneventfully the toe is sitting nice and rectus.  Assessment: Well-healing surgical foot.  Plan: I will follow-up with her in 2 weeks I discussed massage therapy with her to help breakdown the scar also discussed for her to continue to utilize the plantar flexing brace.

## 2019-01-21 ENCOUNTER — Ambulatory Visit (INDEPENDENT_AMBULATORY_CARE_PROVIDER_SITE_OTHER): Payer: 59

## 2019-01-21 ENCOUNTER — Ambulatory Visit (INDEPENDENT_AMBULATORY_CARE_PROVIDER_SITE_OTHER): Payer: 59 | Admitting: Podiatry

## 2019-01-21 DIAGNOSIS — M2041 Other hammer toe(s) (acquired), right foot: Secondary | ICD-10-CM

## 2019-01-21 DIAGNOSIS — M216X1 Other acquired deformities of right foot: Secondary | ICD-10-CM | POA: Diagnosis not present

## 2019-01-21 DIAGNOSIS — Z09 Encounter for follow-up examination after completed treatment for conditions other than malignant neoplasm: Secondary | ICD-10-CM

## 2019-01-21 NOTE — Progress Notes (Signed)
She presents today for postop visit date of surgery December 11, 2018 status post second metatarsal osteotomy right hammertoe repair with screw right foot.  She denies fever chills nausea vomiting muscle aches pain states that everything is fine.  Objective: Vital signs are stable alert and oriented x3 dry sterile dressing was removed demonstrates a rectus second toe in good position.  Minimal edema no erythema cellulitis drainage or odor good dorsiflexion and plantarflexion.  Radiographs taken today demonstrate a well-healing osteotomy.  Assessment: Well-healing surgical foot.  Plan: Allow her to get back into a pair of stiff soled tennis shoes otherwise she will continue use of the Darco shoe and I will follow-up with her in another 2 to 4 weeks.

## 2019-02-09 ENCOUNTER — Ambulatory Visit (INDEPENDENT_AMBULATORY_CARE_PROVIDER_SITE_OTHER): Payer: 59

## 2019-02-09 ENCOUNTER — Ambulatory Visit (INDEPENDENT_AMBULATORY_CARE_PROVIDER_SITE_OTHER): Payer: 59 | Admitting: Podiatry

## 2019-02-09 ENCOUNTER — Encounter: Payer: Self-pay | Admitting: Podiatry

## 2019-02-09 DIAGNOSIS — Z9889 Other specified postprocedural states: Secondary | ICD-10-CM

## 2019-02-09 DIAGNOSIS — M216X1 Other acquired deformities of right foot: Secondary | ICD-10-CM | POA: Diagnosis not present

## 2019-02-09 DIAGNOSIS — M2041 Other hammer toe(s) (acquired), right foot: Secondary | ICD-10-CM

## 2019-02-10 NOTE — Progress Notes (Signed)
She presents today for postop visit date of surgery December 11, 2018 status post second metatarsal osteotomy right foot hammertoe repair second right foot states that the toe still sticks up a bit and is a little bit numb but all in all it seems to be doing well.  Objective: Vital signs are stable alert and oriented x3.  Pulses are palpable.  There is mild edema no erythema cellulitis drainage odor toe appears to be sitting rectus with forefoot loading.  It may not purchase the ground yet but it will as time goes on.  There is no open lesions or wounds she does have some scar tissue that is tight and adhesed to the joint.  Assessment: Well-healing surgical foot right.  Plan: I will follow-up with her in 1 month which time we will take a look at the other foot to possibly perform a small procedure such as a tenotomy to help reduce the chance of the this other second toe doing the same thing.  But allow her to get back into her regular shoe gear.

## 2019-03-19 ENCOUNTER — Telehealth: Payer: Self-pay | Admitting: Podiatry

## 2019-03-19 NOTE — Telephone Encounter (Signed)
I called patient and confirmed her appointment for Tuesday, and I asked could she come in 15 min earlier. She stated that she wanted to know if Dr. Milinda Pointer thought it was necessary for her to still come in for her appointment on 03/23/2019. Please call patient.

## 2019-03-19 NOTE — Telephone Encounter (Signed)
Pt called states she is scheduled to see Dr. Milinda Pointer but it is not necessary unless he wants to see her because she is just having swelling.

## 2019-03-19 NOTE — Telephone Encounter (Signed)
I called pt and explained that with surgery swelling she may swell 6-9 months after the surgery to varying degrees and she should continue with the compression, rest, ice and elevation for those episodes. Pt states she is still wearing the brace Dr. Milinda Pointer had given her and the toe was still sticking up above the others. I told pt I felt she would benefit from keeping her appt with Dr. Milinda Pointer, because there were other modalities that can be prescribed for the toe, and the swelling because she was concerned. Pt states understanding and will keep her appt on 03/23/2019 at 3:00pm.

## 2019-03-23 ENCOUNTER — Other Ambulatory Visit: Payer: Self-pay

## 2019-03-23 ENCOUNTER — Ambulatory Visit (INDEPENDENT_AMBULATORY_CARE_PROVIDER_SITE_OTHER): Payer: 59 | Admitting: Podiatry

## 2019-03-23 ENCOUNTER — Encounter: Payer: Self-pay | Admitting: Podiatry

## 2019-03-23 DIAGNOSIS — M2041 Other hammer toe(s) (acquired), right foot: Secondary | ICD-10-CM

## 2019-03-23 DIAGNOSIS — M216X1 Other acquired deformities of right foot: Secondary | ICD-10-CM

## 2019-03-23 DIAGNOSIS — Z9889 Other specified postprocedural states: Secondary | ICD-10-CM

## 2019-03-24 ENCOUNTER — Encounter: Payer: Self-pay | Admitting: Podiatry

## 2019-03-24 NOTE — Progress Notes (Signed)
She presents today for right foot.  States that date of surgery December 11, 2018 everything is going great she is very happy with the outcome sec metatarsal is healing well and the second toe is starting to come down.  Objective: Vital signs are stable she is alert and oriented x3.  Pulses are palpable.  Surgical foot is going on to heal uneventfully incision site is healing very nicely second toe is slightly elevated but is coming down is starting to sit down normally.  She is getting continue to use her flexor stabilizer on that to pull out toe down.  Assessment: Well-healing surgical foot right.  Plan: At this point were going to follow-up with her in the middle of June for flexor tenotomy second digit left foot in hopes to prevent another surgery on the right foot.

## 2019-05-13 ENCOUNTER — Ambulatory Visit: Payer: 59 | Admitting: Obstetrics and Gynecology

## 2019-05-13 ENCOUNTER — Other Ambulatory Visit (HOSPITAL_COMMUNITY)
Admission: RE | Admit: 2019-05-13 | Discharge: 2019-05-13 | Disposition: A | Discharge status: Home / Self Care | Payer: 59 | Source: Ambulatory Visit | Attending: Obstetrics and Gynecology | Admitting: Obstetrics and Gynecology

## 2019-05-13 ENCOUNTER — Ambulatory Visit (INDEPENDENT_AMBULATORY_CARE_PROVIDER_SITE_OTHER): Payer: 59 | Admitting: Obstetrics and Gynecology

## 2019-05-13 ENCOUNTER — Other Ambulatory Visit: Payer: Self-pay

## 2019-05-13 ENCOUNTER — Encounter: Payer: Self-pay | Admitting: Obstetrics and Gynecology

## 2019-05-13 VITALS — BP 104/60 | HR 80 | Temp 97.7°F | Ht 63.25 in | Wt 146.0 lb

## 2019-05-13 DIAGNOSIS — Z780 Asymptomatic menopausal state: Secondary | ICD-10-CM

## 2019-05-13 DIAGNOSIS — Z01419 Encounter for gynecological examination (general) (routine) without abnormal findings: Secondary | ICD-10-CM | POA: Insufficient documentation

## 2019-05-13 MED ORDER — METHYLPREDNISOLONE 4 MG PO TBPK: ORAL_TABLET | ORAL | 0 refills | Status: DC

## 2019-05-13 NOTE — Patient Instructions (Signed)

## 2019-05-13 NOTE — Progress Notes (Signed)
58 y.o. [id number] Single Caucasian female here for annual exam.    Working during the pandemic.   Had hep A and B vaccines this fall.   Wants a pap today.   States she has severe poison ivy yearly.  She now has a rash and it is progressive.  Thinks it is from working with [employer].  No SOB or lip swelling.  OTC meds not working. She is asking for a prescription today for a steroid.   Gardening, walking, and golfing.   PCP: Rachell Cipro, MD  Patient's last menstrual period was 12/03/2011.           Sexually active: No.  The current method of family planning is post menopausal status.    Exercising: Yes.    gardening, golf, walk Smoker:  no  Health Maintenance: Pap:  04/07/17 Neg:Neg HR HPV, 04/08/18 - negative:neg HR HPV History of abnormal Pap:  Yes, Pap 04/03/16 - ASCUS and positive HR HPV. 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 Pap in 2016 was negative and negative HR HPV.  Pap in 2015 was normal and positive HR HPV and negative subtypes 16 and 18.Hx LEEP in 2006 or 2007. Hx of cryotherapy. MMG:  07/24/18 BIRADS 1 negative/density c Colonoscopy:  4/2018normal with Dr.Mann;next 03/2027 BMD:   03/23/15  Result  Osteopenia TDaP:  12/08/2013 Gardasil:   n/a HIV: done  Hep C: 2017 Neg  Screening Labs: PCP   reports that she has never smoked. She has never used smokeless tobacco. She reports current alcohol use of about 7.0 standard drinks of alcohol per week. She reports that she does not use drugs.  Past Medical History:  Diagnosis Date  . Anemia   . Arthritis   . Depression    rx at same time as thyroid  on meds for over 20 years had some relpase when tried to go off feels normal on medication  . Fibroid   . Hx of abnormal cervical Pap smear    rx cryo and leep in past  . Osteopenia   . STD (sexually transmitted disease)    HPV  .  Thyroid disease    hypothyroidism hashimotos   . Vitamin D deficiency     Past Surgical History:  Procedure Laterality Date  . AUGMENTATION MAMMAPLASTY     bilateralrectro pectoral saline 2006  . BREAST SURGERY     cysts removed from Lt and Rt. breasts-benign    Current Outpatient Medications  Medication Sig Dispense Refill  . Ascorbic Acid (VITAMIN C) 1000 MG tablet Take 1,000 mg by mouth daily.    Marland Kitchen buPROPion (WELLBUTRIN XL) 300 MG 24 hr tablet Take 1 tablet by mouth daily.  3  . cholecalciferol (VITAMIN D) 1000 units tablet Take 3,000 Units by mouth daily.    . ferrous sulfate 325 (65 FE) MG EC tablet Take 325 mg by mouth daily.    . sertraline (ZOLOFT) 50 MG tablet TAKE 1 AND 1/2 TABLETS BY MOUTH DAILY 15 tablet 0  . SYNTHROID 150 MCG tablet Take 1 tablet by mouth daily.     No current facility-administered medications for this visit.     Family History  Problem Relation Age of Onset  . Arthritis Father   . Cancer Father        prostate  . Breast cancer Mother        about age  15  . Breast cancer Paternal Grandmother   . Celiac disease Sister     Review of Systems  All other systems reviewed and are negative.   Exam:   BP 104/60   Pulse 80   Temp 97.7 F (36.5 C) (Temporal)   Ht 5' 3.25" (1.607 m)   Wt 146 lb (66.2 kg)   LMP 12/03/2011   BMI 25.66 kg/m     General appearance: alert, cooperative and appears stated age Head: normocephalic, without obvious abnormality, atraumatic Neck: no adenopathy, supple, symmetrical, trachea midline and thyroid normal to inspection and palpation Lungs: clear to auscultation bilaterally Breasts: normal appearance, no masses or tenderness, No nipple retraction or dimpling, No nipple discharge or bleeding, No axillary adenopathy Heart: regular rate and rhythm Abdomen: soft, non-tender; no masses, no organomegaly Extremities: extremities normal, atraumatic, no cyanosis or edema Skin: skin color, texture, turgor normal.   Decreased pigmentation consistent with vitaligo.  Raised excoriated rash of chest and left thigh.  Lymph nodes: cervical, supraclavicular, and axillary nodes normal. Neurologic: grossly normal  Pelvic: External genitalia:  no lesions              No abnormal inguinal nodes palpated.              Urethra:  normal appearing urethra with no masses, tenderness or lesions              Bartholins and Skenes: normal                 Vagina: normal appearing vagina with normal color and discharge, no lesions              Cervix: no lesions              Pap taken: Yes.   Bimanual Exam:  Uterus:  normal size, contour, position, consistency, mobility, non-tender              Adnexa: no mass, fullness, tenderness              Rectal exam: Yes.  .  Confirms.              Anus:  normal sphincter tone, no lesions  Chaperone was present for exam.  Assessment:   Well woman visit with normal exam. Hx LGSIL. Hx LEEP and cryo. Hyperpigmentation of the right medial labia minora.  Vulvar melanosis.  Bilateral breast implants. FH breast cancer.  Mother - postmenopausal. Osteopenia.  Rash.  Looks like allergic rxn.   Plan: Mammogram screening discussed. Self breast awareness reviewed. Pap and HR HPV as above. Guidelines for Calcium, Vitamin D, regular exercise program including cardiovascular and weight bearing exercise. BMD this year.  Labs with PCP including thyroid.  Medrol dose pack prescribed.  Instructed in use.  She will go to the ER if she develops shortness of breath, lip/throat swelling.  Follow up annually and prn.   After visit summary provided.

## 2019-05-17 ENCOUNTER — Telehealth: Payer: Self-pay | Admitting: *Deleted

## 2019-05-17 NOTE — Telephone Encounter (Signed)
Pt called states she has an appt on Thursday and would like to know how long she would have to stay off her foot she has plans for Saturday.

## 2019-05-18 LAB — CYTOLOGY - PAP
Diagnosis: NEGATIVE
HPV: NOT DETECTED

## 2019-05-18 NOTE — Telephone Encounter (Signed)
She would possibly have a stitch and would have to keep the foot clean and dry for a week and wear a darco.

## 2019-05-20 ENCOUNTER — Encounter: Payer: Self-pay | Admitting: Podiatry

## 2019-05-20 ENCOUNTER — Ambulatory Visit: Payer: 59

## 2019-05-20 ENCOUNTER — Ambulatory Visit (INDEPENDENT_AMBULATORY_CARE_PROVIDER_SITE_OTHER): Payer: 59 | Admitting: Podiatry

## 2019-05-20 ENCOUNTER — Other Ambulatory Visit: Payer: Self-pay

## 2019-05-20 VITALS — Temp 98.6°F

## 2019-05-20 DIAGNOSIS — M7752 Other enthesopathy of left foot: Secondary | ICD-10-CM

## 2019-05-20 DIAGNOSIS — M2450 Contracture, unspecified joint: Secondary | ICD-10-CM | POA: Diagnosis not present

## 2019-05-20 NOTE — Progress Notes (Signed)
She presents today for a tenotomy of the second toe left foot.  We have planned this in order to help prevent a second metatarsal osteotomy which was performed to the contralateral foot.  She states that she would like to have this mild hammertoe released while it is still flexible.  Objective: Vital signs are stable she is alert and oriented x3.  Pulses are palpable.  Neurologic sensorium is intact.  DP reflexes are intact.  Muscle strength is normal and symmetrical.  She has mild contraction dorsiflexor nature at the metatarsal phalangeal joint second left.  Mild flexor contraction at the PIPJ with some osteoarthritic changes.  Assessment: Contracted second metatarsal phalangeal joint left foot.  Plan: After local anesthetic was administered the area was prepped and draped as normal sterile fashion.  Using 11 blade I made a small stab incision and transected the extensor tendon to the second toe.  This allowed the second toe to sit in a rectus position opposing the third toe and the hallux.  There was no bleeding the area was flushed tincture of benzoin was placed with Steri-Strips and a dry sterile compressive dressing was applied I will follow-up with her in 1 week make sure she is doing well.  She will continue use a Darco shoe should she have questions or concerns she will notify me immediately.

## 2019-06-03 ENCOUNTER — Other Ambulatory Visit: Payer: Self-pay

## 2019-06-03 ENCOUNTER — Ambulatory Visit (INDEPENDENT_AMBULATORY_CARE_PROVIDER_SITE_OTHER): Payer: 59 | Admitting: Podiatry

## 2019-06-03 ENCOUNTER — Encounter: Payer: Self-pay | Admitting: Podiatry

## 2019-06-03 VITALS — Temp 97.8°F

## 2019-06-03 DIAGNOSIS — Z9889 Other specified postprocedural states: Secondary | ICD-10-CM

## 2019-06-03 DIAGNOSIS — M2450 Contracture, unspecified joint: Secondary | ICD-10-CM

## 2019-06-03 NOTE — Progress Notes (Signed)
She presents today for follow-up of her tenotomy second digit left foot states that is really doing fine is gone on to heal uneventfully the tip of the toe turned down a little bit.  At least is not sitting up in regular shoes like it was.  Objective: Vital signs are stable alert and oriented x3.  Pulses are palpable.  The tip of the toe does turn down a bit but that is because of the arthritis and the change in the arthritis at the level of the PIPJ it was plantarflexed prior to [place] decreasing the elevation of the dorsiflexion and now that it is level at the metatarsal phalangeal joint it appears to be more plantarflexed.  Assessment well-healing tenotomy dorsal second left  Plan: I showed her how to wrap the toe to keep the toes straighter and a little more stiff this will allow the tenotomy to fibrosis better and stay in 1 place I will follow-up with her as needed.

## 2019-06-11 ENCOUNTER — Other Ambulatory Visit: Payer: Self-pay | Admitting: Obstetrics and Gynecology

## 2019-06-11 DIAGNOSIS — Z1231 Encounter for screening mammogram for malignant neoplasm of breast: Secondary | ICD-10-CM

## 2019-08-17 ENCOUNTER — Ambulatory Visit
Admission: RE | Admit: 2019-08-17 | Discharge: 2019-08-17 | Disposition: A | Discharge status: Home / Self Care | Payer: 59 | Source: Ambulatory Visit | Attending: Obstetrics and Gynecology | Admitting: Obstetrics and Gynecology

## 2019-08-17 ENCOUNTER — Other Ambulatory Visit: Payer: Self-pay

## 2019-08-17 DIAGNOSIS — Z1231 Encounter for screening mammogram for malignant neoplasm of breast: Secondary | ICD-10-CM

## 2019-08-17 DIAGNOSIS — Z780 Asymptomatic menopausal state: Secondary | ICD-10-CM

## 2020-05-15 ENCOUNTER — Ambulatory Visit: Payer: 59 | Admitting: Obstetrics and Gynecology

## 2020-05-29 NOTE — Progress Notes (Signed)
59 y.o. G61P2002 Single Caucasian female here for annual exam.    No vaginal bleeding.   Just retired.  Had Covid vaccine.   PCP: Rachell Cipro, MD    Patient's last menstrual period was 12/03/2011.           Sexually active: No.  The current method of family planning is post menopausal status.    Exercising: Yes.    walking and golfing Smoker:  no  Health Maintenance: Pap: 04-09-19 Neg:Neg HR HPV, 04-08-18 Neg:Neg HR HPV, 04-07-17 Neg:Neg HR HPV History of abnormal Pap:  Yes, Pap 04/03/16 - ASCUS and positive HR HPV. 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 Pap in 2016 was negative and negative HR HPV.  Pap in 2015 was normal and positive HR HPV and negative subtypes 16 and 18.Hx LEEP in 2006 or 2007. Hx of cryotherapy.  MMG: 08-17-19 3D/Implants/Neg/density C/BiRads1 Colonoscopy:   4/2018normal with Dr.Mann;next 03/2027 BMD: 08-17-19  Result :Osteopenia--Lt.hip improved TDaP: 12-08-13 Gardasil:   n/a HIV: Neg in the past Hep C: 04-03-16 Neg Screening Labs:  PCP.    reports that she has never smoked. She has never used smokeless tobacco. She reports current alcohol use of about 7.0 standard drinks of alcohol per week. She reports that she does not use drugs.  Past Medical History:  Diagnosis Date  . Anemia   . Arthritis   . Depression    rx at same time as thyroid  on meds for over 20 years had some relpase when tried to go off feels normal on medication  . Fibroid   . Hx of abnormal cervical Pap smear    rx cryo and leep in past  . Osteopenia   . STD (sexually transmitted disease)    HPV  . Thyroid disease    hypothyroidism hashimotos   . Vitamin D deficiency     Past Surgical History:  Procedure Laterality Date  . AUGMENTATION MAMMAPLASTY     bilateralrectro pectoral saline 2006  . BREAST SURGERY     cysts removed from Lt and Rt.  breasts-benign    Current Outpatient Medications  Medication Sig Dispense Refill  . Ascorbic Acid (VITAMIN C) 1000 MG tablet Take 1,000 mg by mouth daily.    Marland Kitchen buPROPion (WELLBUTRIN XL) 300 MG 24 hr tablet Take 1 tablet by mouth daily.  3  . cholecalciferol (VITAMIN D) 1000 units tablet Take 3,000 Units by mouth daily.    . ferrous sulfate 325 (65 FE) MG EC tablet Take 325 mg by mouth daily.    . sertraline (ZOLOFT) 50 MG tablet TAKE 1 AND 1/2 TABLETS BY MOUTH DAILY 15 tablet 0  . SYNTHROID 150 MCG tablet Take 1 tablet by mouth daily.     No current facility-administered medications for this visit.    Family History  Problem Relation Age of Onset  . Arthritis Father   . Cancer Father        prostate  . Breast cancer Mother        about age 67  . Breast cancer Paternal Grandmother   . Celiac disease Sister     Review of Systems  All other systems reviewed and are negative.   Exam:   BP 104/62   Pulse 76   Resp 20   Ht 5' 3.5" (1.613 m)   Wt 145 lb (65.8 kg)   LMP 12/03/2011  BMI 25.28 kg/m     General appearance: alert, cooperative and appears stated age Head: normocephalic, without obvious abnormality, atraumatic Neck: no adenopathy, supple, symmetrical, trachea midline and thyroid normal to inspection and palpation Lungs: clear to auscultation bilaterally Breasts: bilateral implants, normal appearance, no masses or tenderness, No nipple retraction or dimpling, No nipple discharge or bleeding, No axillary adenopathy Heart: regular rate and rhythm Abdomen: soft, non-tender; no masses, no organomegaly Extremities: extremities normal, atraumatic, no cyanosis or edema Skin: skin color, texture, turgor normal. No rashes or lesions Lymph nodes: cervical, supraclavicular, and axillary nodes normal. Neurologic: grossly normal  Pelvic: External genitalia: increased pigmentation right labia minora.               No abnormal inguinal nodes palpated.              Urethra:   normal appearing urethra with no masses, tenderness or lesions              Bartholins and Skenes: normal                 Vagina: normal appearing vagina with normal color and discharge, atrophy.               Cervix: no lesions              Pap taken: No. Bimanual Exam:  Uterus:  normal size, contour, position, consistency, mobility, non-tender              Adnexa: no mass, fullness, tenderness              Rectal exam: Yes.  .  Confirms.              Anus:  normal sphincter tone, no lesions  Chaperone was present for exam.  Assessment:   Well woman visit with normal exam. Hx LGSIL. Hx LEEP and cryo. Hyperpigmentation of the right medial labia minora. Vulvar melanosis.  Bilateral breast implants. FH breast cancer.  Mother - postmenopausal. Osteopenia. Vaginal atrophy.   Plan: Mammogram screening discussed. Self breast awareness reviewed. Pap and HR HPV in 2023.  Guidelines for Calcium, Vitamin D, regular exercise program including cardiovascular and weight bearing exercise. We discussed water based lubricants, cooking oils, vit E suppositories, and vaginal estrogen.  She will try over the counter products.  BMD in 2022.  Follow up annually and prn.   After visit summary provided.

## 2020-05-30 ENCOUNTER — Ambulatory Visit (INDEPENDENT_AMBULATORY_CARE_PROVIDER_SITE_OTHER): Payer: 59 | Admitting: Obstetrics and Gynecology

## 2020-05-30 ENCOUNTER — Other Ambulatory Visit: Payer: Self-pay

## 2020-05-30 ENCOUNTER — Encounter: Payer: Self-pay | Admitting: Obstetrics and Gynecology

## 2020-05-30 ENCOUNTER — Telehealth: Payer: Self-pay | Admitting: Obstetrics and Gynecology

## 2020-05-30 VITALS — BP 104/62 | HR 76 | Resp 20 | Ht 63.5 in | Wt 145.0 lb

## 2020-05-30 DIAGNOSIS — Z01419 Encounter for gynecological examination (general) (routine) without abnormal findings: Secondary | ICD-10-CM

## 2020-05-30 NOTE — Telephone Encounter (Signed)
Please place in recall for pap in 2023.   She has a history of prior LEEP, cryo, and positive HR HPV.

## 2020-05-30 NOTE — Patient Instructions (Signed)

## 2020-06-06 NOTE — Telephone Encounter (Signed)
Patient placed in pap recall for 05-02-22.

## 2020-08-28 ENCOUNTER — Other Ambulatory Visit: Payer: Self-pay | Admitting: Obstetrics and Gynecology

## 2020-08-28 DIAGNOSIS — Z1231 Encounter for screening mammogram for malignant neoplasm of breast: Secondary | ICD-10-CM

## 2020-09-21 ENCOUNTER — Ambulatory Visit: Payer: 59

## 2020-10-30 ENCOUNTER — Telehealth: Payer: Self-pay

## 2020-10-30 DIAGNOSIS — E039 Hypothyroidism, unspecified: Secondary | ICD-10-CM

## 2020-10-30 NOTE — Telephone Encounter (Signed)
AEX 05/2020 H/o hypothyroidism  H/o osteopenia  PMP, no HRT  Spoke with pt. Pt asking for referral to endocrinologist to help manage hypothyroidism. Pt has been having management through PCP and would like speciality. Pt states called [facility] and needs referral. Would like with first provider.  Referral placed.  Advised pt will give update to Dr Quincy Simmonds.   Pt also was advised by PCP to stop taking Calcium supplement due to possible plaque build up. Pt states has not stopped taking Calcium supplement due to wanting to know Dr Elza Rafter opinion. Pt states is taking with Vit D for Osteopenia.  Advised will review with Dr Quincy Simmonds and return call. Pt agreeable and thankful for recommendation.   Routing to Dr Quincy Simmonds.

## 2020-10-30 NOTE — Telephone Encounter (Signed)
Patient is calling for a referral for Endocrinologist.

## 2020-10-31 NOTE — Telephone Encounter (Signed)
Spoke with pt. Pt given update and recommendations per Dr Quincy Simmonds. Pt states dropped off recent labs to [facility] this morning and will call her soon with appt after chart review.  Pt will continue to take daily calcium and vit D supplements until seen by [facility]. Pt thankful for advice and referral.  Encounter closed

## 2020-10-31 NOTE — Telephone Encounter (Signed)
Referral signed for endocrinology consultation.   She will need to get a copy of some of her recent blood work to bring to her endocrinology appointment.   Daily calcium requirements are about 1200 mg and daily vitamin D requirements are at least 600 - 800 IU for adults.  She can do this through supplements or through 3 - 4 servings of daily products daily, depending on her preference.

## 2020-11-01 ENCOUNTER — Other Ambulatory Visit: Payer: Self-pay

## 2020-11-01 ENCOUNTER — Ambulatory Visit
Admission: RE | Admit: 2020-11-01 | Discharge: 2020-11-01 | Disposition: A | Discharge status: Home / Self Care | Payer: 59 | Source: Ambulatory Visit | Attending: Obstetrics and Gynecology | Admitting: Obstetrics and Gynecology

## 2020-11-01 DIAGNOSIS — Z1231 Encounter for screening mammogram for malignant neoplasm of breast: Secondary | ICD-10-CM

## 2020-12-07 ENCOUNTER — Encounter: Payer: 59 | Admitting: Plastic Surgery

## 2020-12-20 ENCOUNTER — Encounter: Payer: Self-pay | Admitting: Plastic Surgery

## 2020-12-20 ENCOUNTER — Other Ambulatory Visit: Payer: Self-pay

## 2020-12-20 ENCOUNTER — Ambulatory Visit (INDEPENDENT_AMBULATORY_CARE_PROVIDER_SITE_OTHER): Payer: Self-pay | Admitting: Plastic Surgery

## 2020-12-20 VITALS — BP 121/56 | HR 74

## 2020-12-20 DIAGNOSIS — Z411 Encounter for cosmetic surgery: Secondary | ICD-10-CM

## 2020-12-20 NOTE — Progress Notes (Signed)
Patient presents to discuss Botox treatment.  She has had this before from Dr. Dessie Coma.  She cannot remember the exact amount but he treated the forehead, glabella and crows feet.  She is interested in an additional treatment for dynamic and static lines.  On examination she has dynamic and static lines of the forehead, glabella and crows feet more pronounced on the forehead.  This is the area that bothers her the most.  We went over the risks and benefits of Botox treatment and she is interested in proceeding.  The forehead, glabella and crows feet were prepped with an alcohol pad and 36 units of Botox were distributed throughout.  She tolerated this fine.  I have offered to see her again in 2 weeks to do any touchups but if everything is satisfactory then we will see her again at her next treatment.

## 2021-01-01 ENCOUNTER — Other Ambulatory Visit: Payer: Self-pay

## 2021-01-03 ENCOUNTER — Ambulatory Visit (INDEPENDENT_AMBULATORY_CARE_PROVIDER_SITE_OTHER): Payer: 59 | Admitting: Internal Medicine

## 2021-01-03 ENCOUNTER — Encounter: Payer: Self-pay | Admitting: Internal Medicine

## 2021-01-03 ENCOUNTER — Other Ambulatory Visit: Payer: Self-pay

## 2021-01-03 VITALS — BP 120/60 | HR 80 | Ht 63.5 in | Wt 144.1 lb

## 2021-01-03 DIAGNOSIS — E063 Autoimmune thyroiditis: Secondary | ICD-10-CM

## 2021-01-03 LAB — TSH: TSH: 4.15 u[IU]/mL (ref 0.35–4.50)

## 2021-01-03 MED ORDER — SYNTHROID 150 MCG PO TABS: 150.0000 ug | ORAL_TABLET | ORAL | 6 refills | Status: DC

## 2021-01-03 NOTE — Patient Instructions (Signed)

## 2021-01-03 NOTE — Progress Notes (Signed)
Name: Lori Carney  MRN/ DOB: [id number], [birth year]    Age/ Sex: 60 y.o., female    PCP: Fanny Bien, MD   Reason for Endocrinology Evaluation: Hashimoto's thyroiditis      Date of Initial Endocrinology Evaluation: 01/03/2021     HPI: Lori Carney is a 60 y.o. female with a past medical history of Hashimoto's thyroiditis and Vitiligo. The patient presented for initial endocrinology clinic visit on 01/03/2021 for consultative assistance with her Hashimoto's Thyroiditis.   She has been diagnosed with hypothyroidism in 1989 ,right after giving birth and was started on LT-4 replacement at the time.   She was on synthroid until recently when it was switched to generic with alternating 137 and 150 mcg since September   Depression is stable  Has chronic constipation  Denies palpitations  No local neck symptoms  Feels like she is on speed with whole body is shaking    No biotin intake   Of note, the pt has a history of Vitiligo since 60  Sisters with celiac disease. No FH of thyroid disease   HOME ENDOCRINE MEDICATION: Alternate Synthroid 150 mcg daily  Synthroid 137 mcg daily       HISTORY:  Past Medical History:  Past Medical History:  Diagnosis Date  . Anemia   . Arthritis   . Depression    rx at same time as thyroid  on meds for over 20 years had some relpase when tried to go off feels normal on medication  . Fibroid   . Hx of abnormal cervical Pap smear    rx cryo and leep in past  . Osteopenia   . STD (sexually transmitted disease)    HPV  . Thyroid disease    hypothyroidism hashimotos   . Vitamin D deficiency    Past Surgical History:  Past Surgical History:  Procedure Laterality Date  . AUGMENTATION MAMMAPLASTY     bilateralrectro pectoral saline 2006  . BREAST SURGERY     cysts removed from Lt and Rt. breasts-benign      Social History:  reports that she has never smoked. She has never used smokeless tobacco. She reports current  alcohol use of about 7.0 standard drinks of alcohol per week. She reports that she does not use drugs.  Family History: family history includes Arthritis in her father; Breast cancer in her mother and paternal grandmother; Cancer in her father; Celiac disease in her sister.   HOME MEDICATIONS: Allergies as of 01/03/2021      Reactions   Macrobid [nitrofurantoin Macrocrystal] Nausea And Vomiting      Medication List       Accurate as of January 03, 2021  3:08 PM. If you have any questions, ask your nurse or doctor.        buPROPion 300 MG 24 hr tablet Commonly known as: WELLBUTRIN XL Take 1 tablet by mouth daily.   cholecalciferol 1000 units tablet Commonly known as: VITAMIN D Take 3,000 Units by mouth daily.   ferrous sulfate 325 (65 FE) MG EC tablet Take 325 mg by mouth daily.   sertraline 50 MG tablet Commonly known as: ZOLOFT TAKE 1 AND 1/2 TABLETS BY MOUTH DAILY   Synthroid 150 MCG tablet Generic drug: levothyroxine Take 1 tablet by mouth daily.   vitamin C 1000 MG tablet Take 1,000 mg by mouth daily.         REVIEW OF SYSTEMS: A comprehensive ROS was conducted with the patient and is negative  except as per HPI    OBJECTIVE:  VS: BP 120/60   Pulse 80   Ht 5' 3.5" (1.613 m)   Wt 144 lb 2 oz (65.4 kg)   LMP 12/03/2011   SpO2 98%   BMI 25.13 kg/m    Wt Readings from Last 3 Encounters:  01/03/21 144 lb 2 oz (65.4 kg)  05/30/20 145 lb (65.8 kg)  05/13/19 146 lb (66.2 kg)     EXAM: General: Pt appears well and is in NAD  Lungs: Clear with good BS bilat with no rales, rhonchi, or wheezes  Heart: Auscultation: RRR.  Abdomen: Normoactive bowel sounds, soft, nontender, without masses or organomegaly palpable  Extremities:  BL LE: No pretibial edema normal ROM and strength.  Skin: Hair: Texture and amount normal with gender appropriate distribution Skin Inspection: No rashes Skin Palpation: Skin temperature, texture, and thickness normal to palpation   Neuro: Cranial nerves: II - XII grossly intact  Motor: Normal strength throughout DTRs: 2+ and symmetric in UE without delay in relaxation phase  Mental Status: Judgment, insight: Intact Orientation: Oriented to time, place, and person Mood and affect: No depression, anxiety, or agitation     DATA REVIEWED: Results for COURTNEY, BELLIZZI (MRN [id number]) as of 01/03/2021 15:28  Ref. Range 01/03/2021 09:18  TSH Latest Ref Range: 0.35 - 4.50 uIU/mL 4.15    08/26/2020 TSH 28.7 uIU/mL   06/27/2020 TSH 0.269 uIU/mL     ASSESSMENT/PLAN/RECOMMENDATIONS:   1. Hashimoto's Thyroiditis:   - Clinically with non-specific symptoms  - No local neck symptoms  - She prefers not to alternate dose , will make the following changes    Medications : Stop synthroid 137 mcg  Take Synthroid 150 mcg , half a tablet on sundays and 1 tablet the rest of the week    Labs in 6 weeks F/U in 3 months   Signed electronically by: Mack Guise, MD  [facility] End[facility]  [address] [place], [place] [address] Phone: [phone] FAX: [phone]   CC: Fanny Bien, [address] [place] [address] Phone: [phone] Fax: [phone]   Return to Endocrinology clinic as below: Future Appointments  Date Time Provider [place]  02/14/2021  9:30 AM [facility] None  03/08/2021 10:45 AM Cindra Presume, MD [facility] None  04/04/2021  8:50 AM Rondell Pardon, Melanie Crazier, MD [facility] None  06/27/2021 10:00 AM Nunzio Cobbs, MD [facility] None

## 2021-01-09 ENCOUNTER — Telehealth: Payer: Self-pay | Admitting: Internal Medicine

## 2021-01-09 MED ORDER — SYNTHROID 150 MCG PO TABS: ORAL_TABLET | ORAL | 6 refills | Status: DC

## 2021-01-09 NOTE — Telephone Encounter (Signed)
Pt called because she has 2 days left of Synthroid and is requesting a refill. Pt states Synthroid is what she knows and is comfortable with but if Dr wants to prescribe something else based on her recent blood work pt is okay with that.  [facility] # [place], [place] Phone:  [phone]  Fax:  [phone]

## 2021-01-09 NOTE — Telephone Encounter (Signed)
Patient called back and she stated that she did not know it was sent. Re-sent Rx as requested.

## 2021-01-09 NOTE — Telephone Encounter (Signed)
Message left for patient to return my call.  

## 2021-01-09 NOTE — Telephone Encounter (Signed)
It shows that a refill was sent on 01/03/2021

## 2021-01-10 ENCOUNTER — Telehealth: Payer: Self-pay | Admitting: Internal Medicine

## 2021-01-10 NOTE — Telephone Encounter (Signed)
Patient called and advised that she need her Bupropion and Sertraline refilled as well. States that her endocrinologist has always managed these medication because her body does not produce serotonin.  Pharmacy is [facility] on [address]

## 2021-01-10 NOTE — Telephone Encounter (Signed)
Spoken to patient and notified Dr Shamleffer's comments. Verbalized understanding.   

## 2021-01-10 NOTE — Telephone Encounter (Signed)
Please advise 

## 2021-01-10 NOTE — Telephone Encounter (Signed)
Please let the pt know that I do not treat low serotonin,hence can not refill her anxiety medications. I only fill her thyroid as that's the organ I treat. Pt will need to reach out  to PCP

## 2021-02-14 ENCOUNTER — Other Ambulatory Visit: Payer: Self-pay

## 2021-02-14 ENCOUNTER — Other Ambulatory Visit (INDEPENDENT_AMBULATORY_CARE_PROVIDER_SITE_OTHER): Payer: 59

## 2021-02-14 ENCOUNTER — Encounter: Payer: Self-pay | Admitting: Internal Medicine

## 2021-02-14 DIAGNOSIS — E063 Autoimmune thyroiditis: Secondary | ICD-10-CM

## 2021-02-14 LAB — TSH: TSH: 0.14 u[IU]/mL — ABNORMAL LOW (ref 0.35–4.50)

## 2021-03-08 ENCOUNTER — Encounter: Payer: 59 | Admitting: Plastic Surgery

## 2021-03-28 ENCOUNTER — Ambulatory Visit (INDEPENDENT_AMBULATORY_CARE_PROVIDER_SITE_OTHER): Payer: Self-pay | Admitting: Plastic Surgery

## 2021-03-28 ENCOUNTER — Other Ambulatory Visit: Payer: Self-pay

## 2021-03-28 DIAGNOSIS — Z411 Encounter for cosmetic surgery: Secondary | ICD-10-CM

## 2021-03-28 NOTE — Progress Notes (Signed)
Patient presents to discuss Botox treatment.  A little over 3 months ago I injected 36 units of Botox in the forehead, glabella and crows feet and she liked this quite a bit.  She did not have any issues.  She is interested in the same thing again.  We reviewed the risks and benefits of Botox treatment and she is agreed to move forward.  The forehead, glabella and crows feet were prepped with an alcohol pad and 36 units of Botox was distributed throughout.  She tolerated this well.  I have asked her to come back within the next few weeks if she feels that she requires any touchups and otherwise we will see her at her next visit.  All of her questions were answered.

## 2021-04-01 DIAGNOSIS — U071 COVID-19: Secondary | ICD-10-CM

## 2021-04-01 HISTORY — DX: COVID-19: U07.1

## 2021-04-03 ENCOUNTER — Telehealth: Payer: Self-pay | Admitting: Internal Medicine

## 2021-04-03 MED ORDER — SYNTHROID 150 MCG PO TABS: ORAL_TABLET | ORAL | 5 refills | Status: DC

## 2021-04-03 MED ORDER — SYNTHROID 150 MCG PO TABS: ORAL_TABLET | ORAL | 0 refills | Status: DC

## 2021-04-03 NOTE — Telephone Encounter (Signed)
Spoken to patient and she is actually switching her Rx to [facility].  Sent Rx as requested.

## 2021-04-03 NOTE — Telephone Encounter (Signed)
Pt had to change app from 04/04/2021 to 05/30/2021.pt is going out of town on certain days, and  1/70/0174 is the first day that the times and sch works.   Patient has 15 days left of her medication and was wondering if she can get her medication refilled until her app day   SYNTHROID 150 MCG tablet  [facility] [place], [place] - [address]  If possible patient would like a phone call back.

## 2021-04-04 ENCOUNTER — Ambulatory Visit: Payer: 59 | Admitting: Internal Medicine

## 2021-05-30 ENCOUNTER — Ambulatory Visit (INDEPENDENT_AMBULATORY_CARE_PROVIDER_SITE_OTHER): Payer: 59 | Admitting: Internal Medicine

## 2021-05-30 ENCOUNTER — Other Ambulatory Visit: Payer: Self-pay

## 2021-05-30 ENCOUNTER — Encounter: Payer: Self-pay | Admitting: Internal Medicine

## 2021-05-30 VITALS — BP 118/62 | HR 63 | Ht 64.0 in | Wt 144.0 lb

## 2021-05-30 DIAGNOSIS — E063 Autoimmune thyroiditis: Secondary | ICD-10-CM | POA: Diagnosis not present

## 2021-05-30 DIAGNOSIS — E538 Deficiency of other specified B group vitamins: Secondary | ICD-10-CM | POA: Diagnosis not present

## 2021-05-30 LAB — VITAMIN D 25 HYDROXY (VIT D DEFICIENCY, FRACTURES): VITD: 55.12 ng/mL (ref 30.00–100.00)

## 2021-05-30 LAB — TSH: TSH: 3.48 u[IU]/mL (ref 0.35–4.50)

## 2021-05-30 LAB — VITAMIN B12: Vitamin B-12: 253 pg/mL (ref 211–911)

## 2021-05-30 MED ORDER — SYNTHROID 150 MCG PO TABS: 150.0000 ug | ORAL_TABLET | ORAL | 3 refills | Status: DC

## 2021-05-30 NOTE — Patient Instructions (Signed)
You are on Synthroid - which is your thyroid hormone supplement. You MUST take this consistently.  You should take this first thing in the morning on an empty stomach with water. You should not take it with other medications. Wait 30min to 1hr prior to eating. If you are taking any vitamins - please take these in the evening.   If you miss a dose, please take your missed dose the following day (double the dose for that day). You should have a pill box for ONLY Synthroid  on your bedside table to help you remember to take your medications.

## 2021-05-30 NOTE — Progress Notes (Signed)
Name: Lori Carney  MRN/ DOB: [id number], 1961/06/19    Age/ Sex: 60 y.o., female     PCP: Fanny Bien, MD   Reason for Endocrinology Evaluation: Hashimoto's Thyroiditis     Initial Endocrinology Clinic Visit: 01/03/2021    PATIENT IDENTIFIER: Lori Carney is a 60 y.o., female with a past medical history of Hashimoto's Thyroiditis and Vitiligo. She has followed with [facility] clinic since 01/03/2021 for consultative assistance with management of her Hashimoto's Thyroiditis .   HISTORICAL SUMMARY:   She has been diagnosed with hypothyroidism in 1989 ,right after giving birth and was started on LT-4 replacement at the time.    She was on synthroid until recently when it was switched to generic with alternating 137 and 150 mcg since September, 2021   Of note, the pt has a history of Vitiligo since 1970  On her initial visit to our clinic she had a TSH of 0.14 uIU/Ml and we reduced Synthroid dose    Sisters with celiac disease. No FH of thyroid disease    SUBJECTIVE:     Today (05/30/2021):  Lori Carney is here for Hashimoto's Thyroiditis .    Has chronic constipation  No local neck swelling  Denies heat intolerance  NO Biotin    She is on Zoloft and bupropion for depression, per pt she was told by her previous endocrinologist that this is an endocrine problem and they manage this for her, I explained to the patient that depression is not an endocrine and its rather a psychiatry problem, patient will discuss this with her PCP for refills She has attempted to take herself off 3 times in the past but does feel poorly after this.  Synthroid 150 mcg ,1 tablet daily except Sunday ( no synthroid on sundays )      HISTORY:  Past Medical History:  Past Medical History:  Diagnosis Date   Anemia    Arthritis    Depression    rx at same time as thyroid  on meds for over 20 years had some relpase when tried to go off feels normal on medication    Fibroid    Hx of abnormal cervical Pap smear    rx cryo and leep in past   Osteopenia    STD (sexually transmitted disease)    HPV   Thyroid disease    hypothyroidism hashimotos    Vitamin D deficiency    Past Surgical History:  Past Surgical History:  Procedure Laterality Date   AUGMENTATION MAMMAPLASTY     bilateralrectro pectoral saline 2006   BREAST SURGERY     cysts removed from Lt and Rt. breasts-benign   Social History:  reports that she has never smoked. She has never used smokeless tobacco. She reports current alcohol use of about 7.0 standard drinks of alcohol per week. She reports that she does not use drugs. Family History:  Family History  Problem Relation Age of Onset   Arthritis Father    Cancer Father        prostate   Breast cancer Mother        about age 25   Breast cancer Paternal Grandmother    Celiac disease Sister      HOME MEDICATIONS: Allergies as of 05/30/2021       Reactions   Macrobid [nitrofurantoin Macrocrystal] Nausea And Vomiting        Medication List        Accurate as of May 30, 2021  8:46 AM. If you have any questions, ask your nurse or doctor.          buPROPion 300 MG 24 hr tablet Commonly known as: WELLBUTRIN XL Take 1 tablet by mouth daily.   cholecalciferol 1000 units tablet Commonly known as: VITAMIN D Take 3,000 Units by mouth daily.   ferrous sulfate 325 (65 FE) MG EC tablet Take 325 mg by mouth daily.   sertraline 50 MG tablet Commonly known as: ZOLOFT TAKE 1 AND 1/2 TABLETS BY MOUTH DAILY   Synthroid 150 MCG tablet Generic drug: levothyroxine Taking half a tablet on Sundays and 1 tablet the rest of the week   vitamin C 1000 MG tablet Take 1,000 mg by mouth daily.          OBJECTIVE:   PHYSICAL EXAM: VS: BP 118/62   Pulse 63   Ht 5\' 4"  (1.626 m)   Wt 144 lb (65.3 kg)   LMP 12/03/2011   SpO2 99%   BMI 24.72 kg/m    EXAM: General: Pt appears well and is in NAD  Neck: General: Supple  without adenopathy. Thyroid: Thyroid size normal.  No goiter or nodules appreciated. No thyroid bruit.  Lungs: Clear with good BS bilat with no rales, rhonchi, or wheezes  Heart: Auscultation: RRR.  Abdomen: Normoactive bowel sounds, soft, nontender, without masses or organomegaly palpable  Extremities:  BL LE: No pretibial edema normal ROM and strength.  Mental Status: Judgment, insight: Intact Orientation: Oriented to time, place, and person Mood and affect: No depression, anxiety, or agitation     DATA REVIEWED:  Results for ORRIE, LASCANO (MRN [id number]) as of 05/30/2021 15:46  Ref. Range 05/30/2021 09:15  VITD Latest Ref Range: 30.00 - 100.00 ng/mL 55.12  Vitamin B12 Latest Ref Range: 211 - 911 pg/mL 253  TSH Latest Ref Range: 0.35 - 4.50 uIU/mL 3.48     ASSESSMENT / PLAN / RECOMMENDATIONS:   Hashimoto's Thyroiditis:  -Patient is clinically and biochemically euthyroid -No local neck symptoms -TSH is normal and will not make any changes at this time   Medications   Continue Synthroid 150 MCG Monday through Saturday (skip Sunday)   2.  Low vitamin B12:  -B12 is at the lower end of normal, patient will be advised to start vitamin B12 1000 MCG daily     Follow-up in 4 months  Signed electronically by: Mack Guise, MD  [facility] Endocrin[facility]  [facility] [address] [place], [place] [address] Phone: [phone] FAX: [phone]      CC: Fanny Bien, [address] [place] [address] Phone: [phone]  Fax: [phone]   Return to Endocrinology clinic as below: No future appointments.

## 2021-06-27 ENCOUNTER — Ambulatory Visit: Payer: 59 | Admitting: Obstetrics and Gynecology

## 2021-06-28 ENCOUNTER — Encounter: Payer: 59 | Admitting: Plastic Surgery

## 2021-07-25 ENCOUNTER — Encounter: Payer: Self-pay | Admitting: Plastic Surgery

## 2021-07-25 ENCOUNTER — Ambulatory Visit (INDEPENDENT_AMBULATORY_CARE_PROVIDER_SITE_OTHER): Payer: Self-pay | Admitting: Plastic Surgery

## 2021-07-25 ENCOUNTER — Other Ambulatory Visit: Payer: Self-pay

## 2021-07-25 DIAGNOSIS — Z411 Encounter for cosmetic surgery: Secondary | ICD-10-CM

## 2021-07-25 NOTE — Progress Notes (Signed)
Patient presents to discuss Botox treatment.  I have injected her in the past with 36 units of the forehead, glabella and crows feet.  She has been happy with this and would like to do the same thing.  We reviewed the risks and benefits and she is interested in moving forward.  The forehead, glabella and crows feet were prepped with an alcohol pad and 36 units of Botox were distributed throughout.  She tolerated this fine.  We will plan to see her next visit.  All of her questions were answered.

## 2021-08-27 ENCOUNTER — Encounter: Payer: Self-pay | Admitting: Obstetrics and Gynecology

## 2021-08-27 ENCOUNTER — Ambulatory Visit (INDEPENDENT_AMBULATORY_CARE_PROVIDER_SITE_OTHER): Payer: 59 | Admitting: Obstetrics and Gynecology

## 2021-08-27 ENCOUNTER — Other Ambulatory Visit: Payer: Self-pay

## 2021-08-27 ENCOUNTER — Telehealth: Payer: Self-pay | Admitting: Obstetrics and Gynecology

## 2021-08-27 VITALS — BP 110/72 | HR 75 | Ht 64.0 in | Wt 140.0 lb

## 2021-08-27 DIAGNOSIS — N644 Mastodynia: Secondary | ICD-10-CM

## 2021-08-27 DIAGNOSIS — Z803 Family history of malignant neoplasm of breast: Secondary | ICD-10-CM

## 2021-08-27 NOTE — Telephone Encounter (Signed)
Please schedule Dx left mammogram and left breast US for patient.  She has left mastalgia.  Bilateral breast implants.  FH of breast cancer in mother and maternal grandmother.

## 2021-08-27 NOTE — Progress Notes (Signed)
GYNECOLOGY  VISIT   HPI: 60 y.o.   Single  Caucasian  female   G2P2002 with Patient's last menstrual period was 12/03/2011.   here for left breast pain for 1 month--feels like a pulling sensation.   Feels left sided pulling all the time.  Some tweaks and twinges.  No heavy lifting.  No mass.  No trauma. Plays gold.   Received Covid booster was in March.  No recent vaccines.    No medical history changes.  Covid in March.   FH of breast cancer in mother and maternal GM.  GYNECOLOGIC HISTORY: Patient's last menstrual period was 12/03/2011. Contraception:  PMP Menopausal hormone therapy:  None Last mammogram: 11-01-20 3D/w Implant//Neg/Birads1 Last pap smear: 04-09-19 Neg:Neg HR HPV, 04-08-18 Neg:Neg HR HPV, 04-07-17 Neg:Neg HR HPV        OB History     Gravida  2   Para  2   Term  2   Preterm      AB      Living  2      SAB      IAB      Ectopic      Multiple      Live Births                 Patient Active Problem List   Diagnosis Date Noted   Low serum vitamin B12 05/30/2021   Hashimoto's thyroiditis 05/30/2021   Calcific bursitis of shoulder 01/28/2018   Allergic rhinitis 05/09/2014   Joint pain 05/09/2014   Preventive measure 05/09/2014   Hashimoto's disease 04/06/2013   Vitiligo 04/06/2013   FHx: celiac disease 04/06/2013    Past Medical History:  Diagnosis Date   Anemia    Arthritis    Depression    rx at same time as thyroid  on meds for over 20 years had some relpase when tried to go off feels normal on medication   Fibroid    Hx of abnormal cervical Pap smear    rx cryo and leep in past   Osteopenia    STD (sexually transmitted disease)    HPV   Thyroid disease    hypothyroidism hashimotos    Vitamin D deficiency     Past Surgical History:  Procedure Laterality Date   AUGMENTATION MAMMAPLASTY     bilateralrectro pectoral saline 2006   BREAST SURGERY     cysts removed from Lt and Rt. breasts-benign    Current Outpatient  Medications  Medication Sig Dispense Refill   Ascorbic Acid (VITAMIN C) 1000 MG tablet Take 1,000 mg by mouth daily.     buPROPion (WELLBUTRIN XL) 300 MG 24 hr tablet Take 1 tablet by mouth daily.  3   cholecalciferol (VITAMIN D) 1000 units tablet Take 3,000 Units by mouth daily.     ferrous sulfate 325 (65 FE) MG EC tablet Take 325 mg by mouth daily.     sertraline (ZOLOFT) 100 MG tablet Take 100 mg by mouth every morning.     SYNTHROID 150 MCG tablet Take 1 tablet (150 mcg total) by mouth as directed. 1 tablet Monday through Saturday (no Synthroid on Sundays) 78 tablet 3   vitamin B-12 (CYANOCOBALAMIN) 1000 MCG tablet Take 1,000 mcg by mouth daily.     No current facility-administered medications for this visit.     ALLERGIES: Macrobid [nitrofurantoin macrocrystal]  Family History  Problem Relation Age of Onset   Arthritis Father    Cancer Father  prostate   Breast cancer Mother        about age 65   Breast cancer Paternal Grandmother    Celiac disease Sister     Social History   Socioeconomic History   Marital status: Single    Spouse name: Not on file   Number of children: Not on file   Years of education: Not on file   Highest education level: Not on file  Occupational History   Not on file  Tobacco Use   Smoking status: Never   Smokeless tobacco: Never  Vaping Use   Vaping Use: Never used  Substance and Sexual Activity   Alcohol use: Yes    Alcohol/week: 7.0 standard drinks    Types: 7 Standard drinks or equivalent per week   Drug use: No   Sexual activity: Not Currently    Partners: Male    Birth control/protection: Post-menopausal  Other Topics Concern   Not on file  Social History Narrative   Regular exercise: seldom   Caffeine use: tea daily   Single porig from [place] parents in [place] has lived [place] and  [place]  In [place] for about 2-3 years.    6 hours of sleep per nights   Lives alone/no pets   Single    G2P2 children grown and healthy   BA  degrees Firefighter  [employer]         Social Determinants of Health   Financial Resource Strain: Not on file  Food Insecurity: Not on file  Transportation Needs: Not on file  Physical Activity: Not on file  Stress: Not on file  Social Connections: Not on file  Intimate Partner Violence: Not on file    Review of Systems  All other systems reviewed and are negative.  PHYSICAL EXAMINATION:    BP 110/72   Pulse 75   Ht 5\' 4"  (1.626 m)   Wt 140 lb (63.5 kg)   LMP 12/03/2011   SpO2 99%   BMI 24.03 kg/m     General appearance: alert, cooperative and appears stated age Head: Normocephalic, without obvious abnormality, atraumatic Lungs: clear to auscultation bilaterally Breasts: bilateral implants, no masses or tenderness, No nipple retraction or dimpling, No nipple discharge or bleeding, No axillary, cervical, or supraclavicular adenopathy Heart: regular rate and rhythm   Chaperone was present for exam:  Estill Bamberg, CMA  ASSESSMENT   Right mastalgia. Bilateral breast implants.  FH breast cancer mother and maternal GM.  PLAN  Ibuprofen, Tylenol, heat, and consider new bra.  Will proceed with dx left mammogram and left breast US at [facility]. Wait to do any upcoming Covid boosters until after dx breast imaging complete. Fu prn.   An After Visit Summary was printed and given to the patient.

## 2021-08-27 NOTE — Patient Instructions (Signed)
Breast Tenderness Breast tenderness is a common problem for women of all ages, but may also occur in men. Breast tenderness may range from mild discomfort to severe pain. In women, the pain usually comes and goes with the menstrual cycle, but it can also be constant. Breast tenderness has many possible causes, including hormone changes, infections, and taking certain medicines. You may have tests, such as a mammogram or an ultrasound, to check for any unusual findings. Having breast tenderness usually does not mean that you have breast cancer. Follow these instructions at home: Managing pain and discomfort  If directed, put ice to the painful area. To do this: Put ice in a plastic bag. Place a towel between your skin and the bag. Leave the ice on for 20 minutes, 2-3 times a day. Wear a supportive bra, especially during exercise. You may also want to wear a supportive bra while sleeping if your breasts are very tender. Medicines Take over-the-counter and prescription medicines only as told by your health care provider. If the cause of your pain is infection, you may be prescribed an antibiotic medicine. If you were prescribed an antibiotic, take it as told by your health care provider. Do not stop taking the antibiotic even if you start to feel better. Eating and drinking Your health care provider may recommend that you lessen the amount of fat in your diet. You can do this by: Limiting fried foods. Cooking foods using methods such as baking, boiling, grilling, and broiling. Decrease the amount of caffeine in your diet. Instead, drink more water and choose caffeine-free drinks. General instructions  Keep a log of the days and times when your breasts are most tender. Ask your health care provider how to do breast exams at home. This will help you notice if you have an unusual growth or lump. Keep all follow-up visits as told by your health care provider. This is important. Contact a health care  provider if: Any part of your breast is hard, red, and hot to the touch. This may be a sign of infection. You are a woman and: Not breastfeeding and you have fluid, especially blood or pus, coming out of your nipples. Have a new or painful lump in your breast that remains after your menstrual period ends. You have a fever. Your pain does not improve or it gets worse. Your pain is interfering with your daily activities. Summary Breast tenderness may range from mild discomfort to severe pain. Breast tenderness has many possible causes, including hormone changes, infections, and taking certain medicines. It can be treated with ice, wearing a supportive bra, and medicines. Make changes to your diet if told to by your health care provider. This information is not intended to replace advice given to you by your health care provider. Make sure you discuss any questions you have with your health care provider. Document Revised: 04/12/2019 Document Reviewed: 04/12/2019 Elsevier Patient Education  2022 Elsevier Inc.  

## 2021-08-27 NOTE — Telephone Encounter (Signed)
Patient scheduled on 09/28/21 @ 1:40pm , patient voicemail full. My chart message sent.

## 2021-08-28 NOTE — Telephone Encounter (Signed)
Patient received my chart message "Last read by Lori Carney at [facility] PM on 08/27/2021."

## 2021-09-13 ENCOUNTER — Ambulatory Visit (INDEPENDENT_AMBULATORY_CARE_PROVIDER_SITE_OTHER): Payer: 59 | Admitting: Obstetrics and Gynecology

## 2021-09-13 ENCOUNTER — Other Ambulatory Visit: Payer: Self-pay

## 2021-09-13 ENCOUNTER — Encounter: Payer: Self-pay | Admitting: Obstetrics and Gynecology

## 2021-09-13 VITALS — BP 110/66 | HR 68 | Ht 63.5 in | Wt 141.0 lb

## 2021-09-13 DIAGNOSIS — Z780 Asymptomatic menopausal state: Secondary | ICD-10-CM

## 2021-09-13 DIAGNOSIS — Z01419 Encounter for gynecological examination (general) (routine) without abnormal findings: Secondary | ICD-10-CM

## 2021-09-13 DIAGNOSIS — M8580 Other specified disorders of bone density and structure, unspecified site: Secondary | ICD-10-CM | POA: Diagnosis not present

## 2021-09-13 NOTE — Progress Notes (Signed)
60 y.o. G11P2002 Single Caucasian female here for annual exam.    May become sexually active again.   Has ongoing right breast tenderness and has dx left mammogram and [place] scheduled for the end of this month.   PCP:  Rachell Cipro, MD  Patient's last menstrual period was 12/03/2011.           Sexually active: No.  The current method of family planning is post menopausal status.    Exercising: No.  The patient does not participate in regular exercise at present. Smoker:  no  Health Maintenance: Pap:  04-09-19 Neg:Neg HR HPV, 04-08-18 Neg:Neg HR HPV, 04-07-17 Neg:Neg HR HPV History of abnormal Pap:  Yes,  Pap 04/03/16 - ASCUS and positive HR HPV. 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 Pap in 2016 was negative and negative HR HPV.  Pap in 2015 was normal and positive HR HPV and negative subtypes 16 and 18.  Hx LEEP in 2006 or 2007. Hx of cryotherapy. MMG: 11-01-20 3D w/implants/Neg/BiRads1.  Has dx mammogram and left breast US on 09/28/21 due to mastalgia.   Colonoscopy:   03/2017 normal with Dr.Mann;next 03/2027 BMD:  08-17-19 Result :Osteopenia. Lt.hip improved TDaP:  12-08-13 Gardasil:   no HIV: Neg in the past Hep C: Neg in the past Screening Labs:  PCP Flu vaccine:  does not usually do this.   reports that she has never smoked. She has never used smokeless tobacco. She reports current alcohol use of about 7.0 standard drinks per week. She reports that she does not use drugs.  Past Medical History:  Diagnosis Date   Anemia    Arthritis    COVID 04/2021   Depression    rx at same time as thyroid  on meds for over 20 years had some relpase when tried to go off feels normal on medication   Fibroid    Hx of abnormal cervical Pap smear    rx cryo and leep in past   Osteopenia    STD (sexually transmitted disease)    HPV   Thyroid disease    hypothyroidism  hashimotos    Vitamin D deficiency     Past Surgical History:  Procedure Laterality Date   AUGMENTATION MAMMAPLASTY     bilateralrectro pectoral saline 2006   BREAST SURGERY     cysts removed from Lt and Rt. breasts-benign    Current Outpatient Medications  Medication Sig Dispense Refill   Ascorbic Acid (VITAMIN C) 1000 MG tablet Take 1,000 mg by mouth daily.     buPROPion (WELLBUTRIN XL) 300 MG 24 hr tablet Take 1 tablet by mouth daily.  3   cholecalciferol (VITAMIN D) 1000 units tablet Take 3,000 Units by mouth daily.     ferrous sulfate 325 (65 FE) MG EC tablet Take 325 mg by mouth daily.     sertraline (ZOLOFT) 100 MG tablet Take 100 mg by mouth every morning.     SYNTHROID 150 MCG tablet Take 1 tablet (150 mcg total) by mouth as directed. 1 tablet Monday through Saturday (no Synthroid on Sundays) 78 tablet 3   vitamin B-12 (CYANOCOBALAMIN) 1000 MCG tablet Take 1,000 mcg by mouth daily.     zolpidem (AMBIEN) 5 MG tablet Take 5-10 mg by mouth at bedtime.     No current facility-administered medications for this visit.    Family History  Problem Relation  Age of Onset   Breast cancer Mother        about age 70   Arthritis Father    Cancer Father        prostate   Celiac disease Sister    Breast cancer Paternal Grandmother     Review of Systems  All other systems reviewed and are negative.  Exam:   BP 110/66   Pulse 68   Ht 5' 3.5" (1.613 m)   Wt 141 lb (64 kg)   LMP 12/03/2011   SpO2 97%   BMI 24.59 kg/m     General appearance: alert, cooperative and appears stated age Head: normocephalic, without obvious abnormality, atraumatic Neck: no adenopathy, supple, symmetrical, trachea midline and thyroid normal to inspection and palpation Lungs: clear to auscultation bilaterally Breasts: normal appearance, bilateral implants, no masses, tender right breast, No nipple retraction or dimpling, No nipple discharge or bleeding, No axillary adenopathy Heart: regular rate and  rhythm Abdomen: soft, non-tender; no masses, no organomegaly Extremities: extremities normal, atraumatic, no cyanosis or edema Skin: skin color, texture, turgor normal. No rashes or lesions.Marland Kitchen areas of hypopigmentation and hyperpigmentation.  Lymph nodes: cervical, supraclavicular, and axillary nodes normal. Neurologic: grossly normal  Pelvic: External genitalia:  melanosis of right labia.               No abnormal inguinal nodes palpated.              Urethra:  normal appearing urethra with no masses, tenderness or lesions              Bartholins and Skenes: normal                 Vagina: normal appearing vagina with normal color and discharge, no lesions.  Atrophy noted.              Cervix: no lesions              Pap taken: no Bimanual Exam:  Uterus:  normal size, contour, position, consistency, mobility, non-tender              Adnexa: no mass, fullness, tenderness              Rectal exam: yes.  Confirms.              Anus:  normal sphincter tone, no lesions  Chaperone was present for exam:  Estill Bamberg, CMA.  Assessment:   Well woman visit with gynecologic exam.  Menopausal female.  Hx LGSIL. Hx LEEP and cryo. Hx recurrent dysplasia. Hyperpigmentation of the right medial labia minora.  Vulvar melanosis.  Bilateral breast implants. FH breast cancer.  Mother - postmenopausal. Osteopenia.  Vaginal atrophy.  Plan: Mammogram screening discussed. Self breast awareness reviewed. Pap and HR HPV 2023.  Guidelines for Calcium, Vitamin D, regular exercise program including cardiovascular and weight bearing exercise. We discussed vaginal hydration with water based lubricants, cooking oils, and vitamin E vaginal suppositories.  BMD ordered.  Patient will call to schedule.  Labs with PCP.  Follow up annually and prn.   After visit summary provided.

## 2021-09-13 NOTE — Patient Instructions (Signed)

## 2021-09-28 ENCOUNTER — Ambulatory Visit: Payer: 59 | Admitting: Internal Medicine

## 2021-09-28 ENCOUNTER — Other Ambulatory Visit: Payer: Self-pay

## 2021-09-28 ENCOUNTER — Ambulatory Visit
Admission: RE | Admit: 2021-09-28 | Discharge: 2021-09-28 | Disposition: A | Discharge status: Home / Self Care | Payer: 59 | Source: Ambulatory Visit | Attending: Obstetrics and Gynecology | Admitting: Obstetrics and Gynecology

## 2021-09-28 ENCOUNTER — Ambulatory Visit: Payer: 59

## 2021-09-28 DIAGNOSIS — N644 Mastodynia: Secondary | ICD-10-CM

## 2021-09-28 DIAGNOSIS — Z803 Family history of malignant neoplasm of breast: Secondary | ICD-10-CM

## 2021-12-05 ENCOUNTER — Other Ambulatory Visit: Payer: Self-pay

## 2021-12-05 ENCOUNTER — Ambulatory Visit (INDEPENDENT_AMBULATORY_CARE_PROVIDER_SITE_OTHER): Payer: Self-pay | Admitting: Plastic Surgery

## 2021-12-05 DIAGNOSIS — Z411 Encounter for cosmetic surgery: Secondary | ICD-10-CM

## 2021-12-05 NOTE — Progress Notes (Signed)
Patient presents to discuss Botox treatment.  She would like some more than last time in the crows feet area.  Last time we did 36 units.  We reviewed the risks and benefits of Botox treatment.  44 units of Botox were drawn up and injected in the crows feet, glabella and forehead in a standard injection pattern with 2 rows for the forehead and 4 injection sites for the crows feet.  She tolerated this fine.  We will plan to see her next visit.  All of her questions were answered.

## 2021-12-09 IMAGING — MG MM DIGITAL DIAGNOSTIC UNILAT*L* IMPLANT W/ TOMO W/ CAD
8 series · 9 of 20 positions shown · non-contrast
Comparison: Previous exam(s).

CLINICAL DATA: Patient presents with nonfocal, waxing waning left
breast pain approximately 2 months. No reported lumps.

EXAM:
DIGITAL DIAGNOSTIC UNILATERAL LEFT MAMMOGRAM WITH IMPLANTS, CAD AND
TOMOSYNTHESIS
TECHNIQUE: Left digital diagnostic mammography and breast tomosynthesis was
performed. The images were evaluated with computer-aided detection.
Standard and/or implant displaced views were performed.

[L CC]
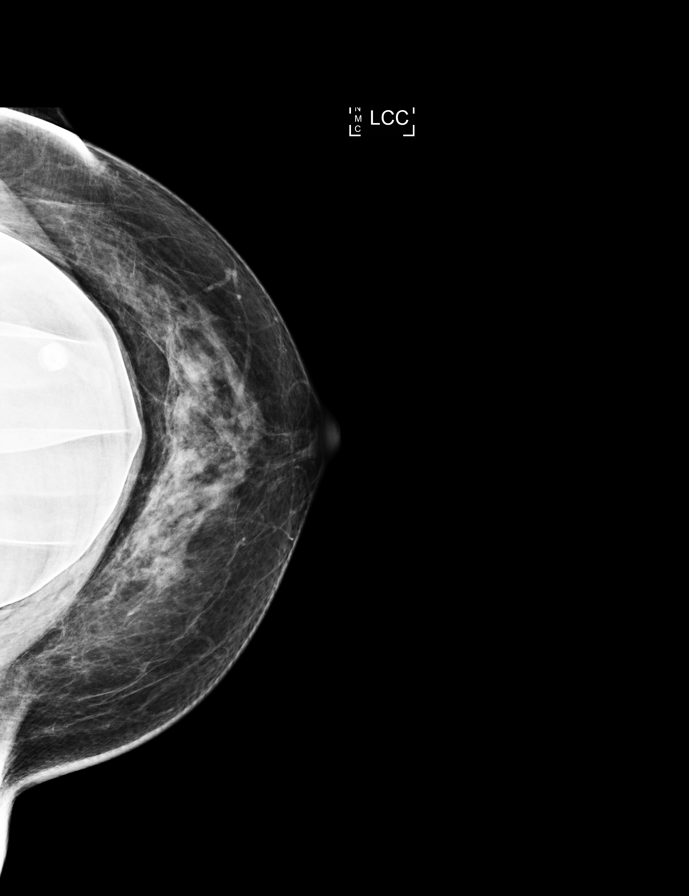

[L MLO]
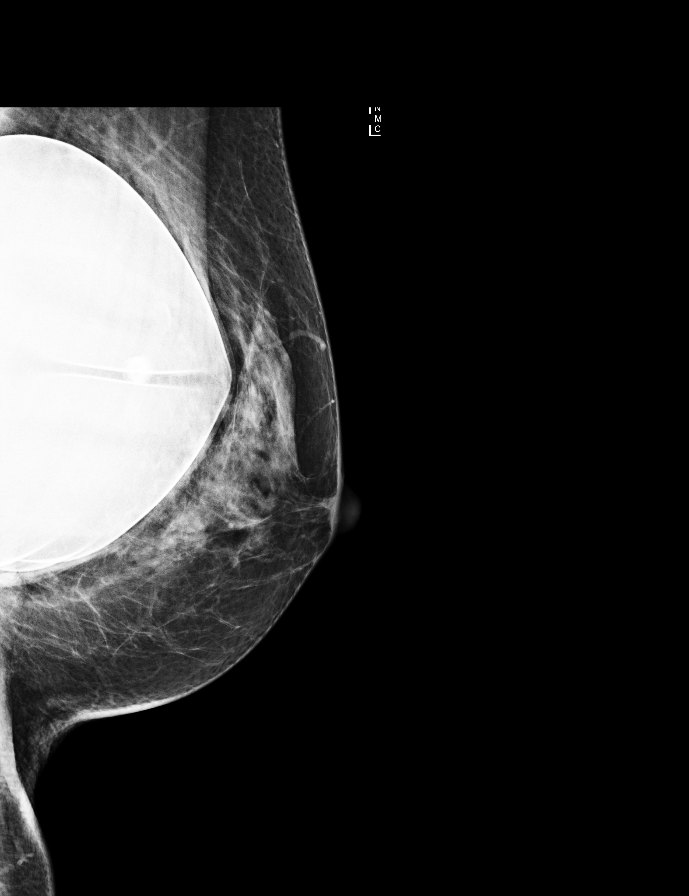

[L MLO synth-2D]
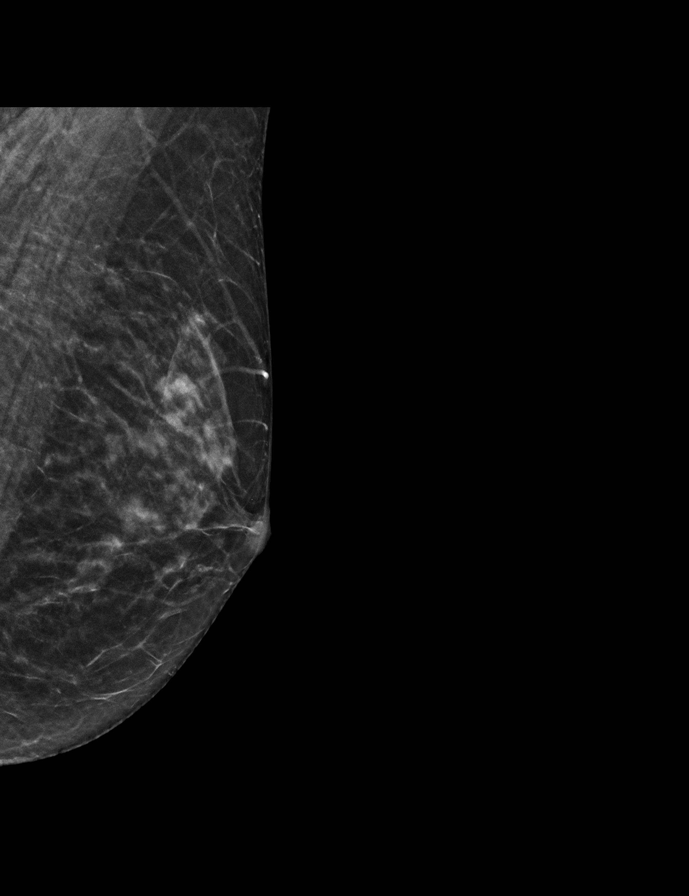

[L CC synth-2D (1 of 2)]
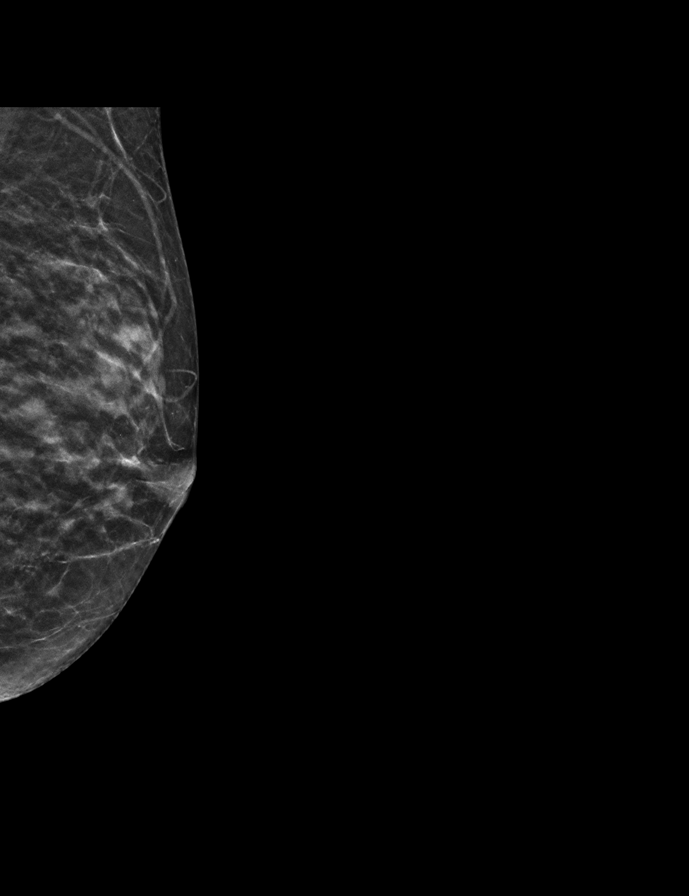

[L CC synth-2D (2 of 2)]
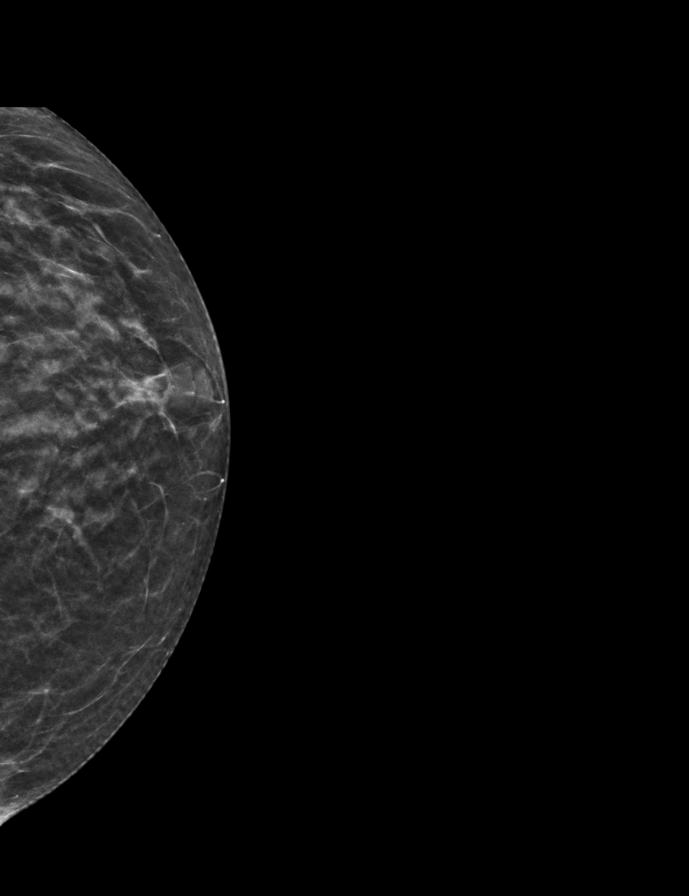

[L CCID BREAST TOMOSYNTHESIS IMAGE tomo · 2 of 39 frames shown (1 of 2)]
[frame 13/39]
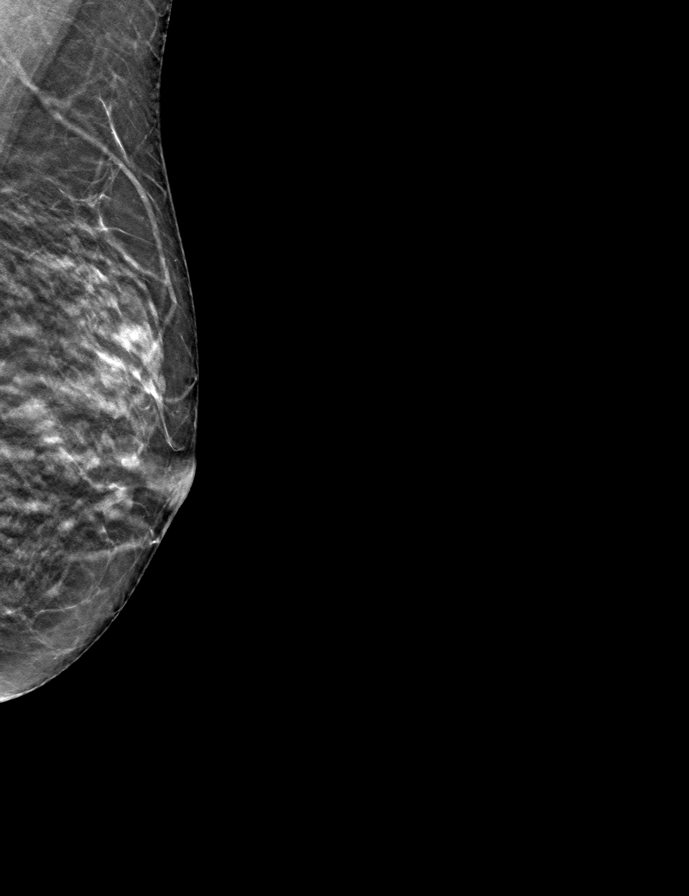
[frame 20/39]
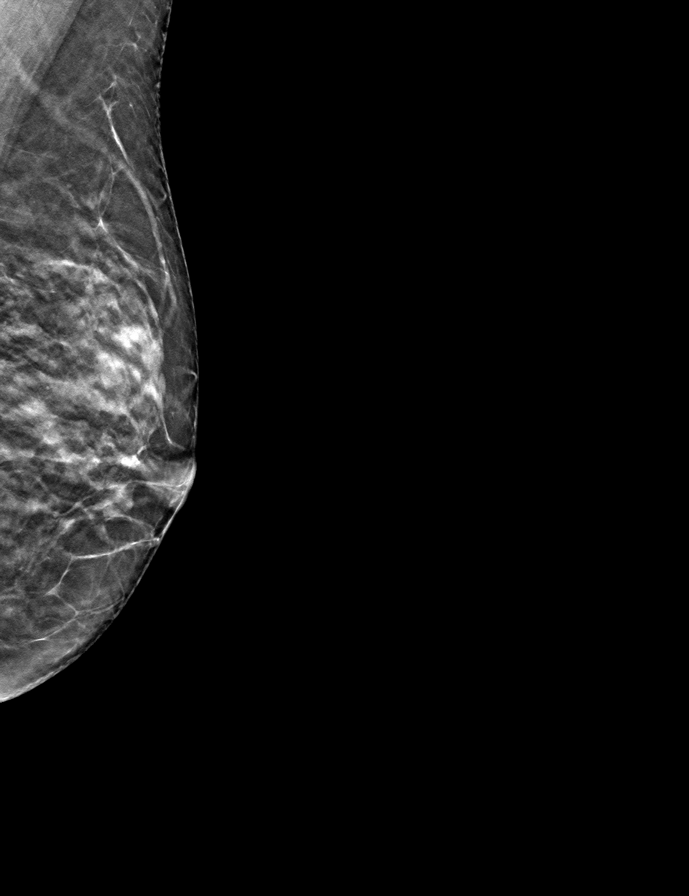

[L CCID BREAST TOMOSYNTHESIS IMAGE tomo (2 of 2) · tomo slice 19/38.0]
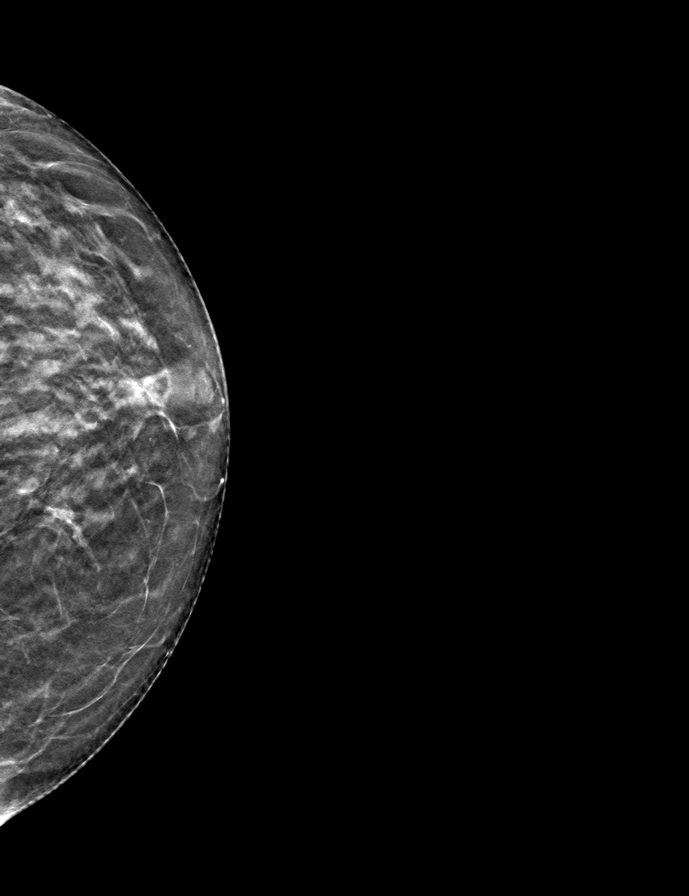

[L MLOID BREAST TOMOSYNTHESIS IMAGE tomo · tomo slice 23/45.0]
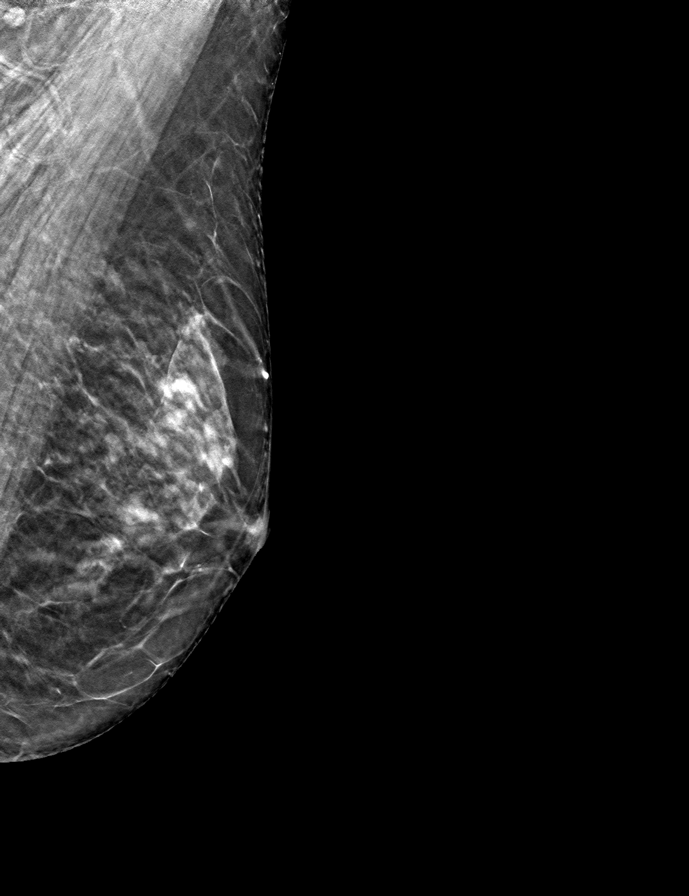

[9 of 20 positions shown; findings below may reference images not displayed]

ACR Breast Density Category c: The breast tissue is heterogeneously
dense, which may obscure small masses.
FINDINGS: There are no masses, areas of architectural distortion, areas of
significant asymmetry or suspicious calcifications. No mammographic
change. The patient has retropectoral implants.
IMPRESSION: No evidence of breast malignancy.

RECOMMENDATION:
Annual screening mammography. Last screening study performed on
11/01/2020.

I have discussed the findings and recommendations with the patient.
If applicable, a reminder letter will be sent to the patient
regarding the next appointment.

BI-RADS CATEGORY  1: Negative.

## 2022-03-27 ENCOUNTER — Encounter: Payer: Self-pay | Admitting: Plastic Surgery

## 2022-03-27 ENCOUNTER — Ambulatory Visit (INDEPENDENT_AMBULATORY_CARE_PROVIDER_SITE_OTHER): Payer: Self-pay | Admitting: Plastic Surgery

## 2022-03-27 DIAGNOSIS — Z411 Encounter for cosmetic surgery: Secondary | ICD-10-CM

## 2022-03-27 NOTE — Progress Notes (Signed)
Patient presents to discuss neuromodulator treatment.  Last time we did 44 units of Botox to the forehead, glabella and crows feet and she liked the results of that.  We reviewed the risks and benefits of Botox treatment and she is interested in moving forward.  Forehead, glabella and crows feet were prepped with an alcohol pad and 44 units of Botox were distributed throughout the forehead, glabella and crows feet.  This was a standard injection pattern with 2 rows for the forehead and 3 injection points for the crows feet.  She tolerated this fine.  We will plan to see her for any touchups or otherwise at her next visit.  All of her questions were answered. ?

## 2022-04-04 ENCOUNTER — Encounter: Payer: 59 | Admitting: Plastic Surgery

## 2022-06-10 ENCOUNTER — Other Ambulatory Visit: Payer: Self-pay | Admitting: Internal Medicine

## 2022-08-15 ENCOUNTER — Encounter: Payer: 59 | Admitting: Surgical

## 2022-09-17 ENCOUNTER — Other Ambulatory Visit: Payer: Self-pay | Admitting: Internal Medicine

## 2022-09-19 ENCOUNTER — Other Ambulatory Visit: Payer: Self-pay

## 2022-09-19 MED ORDER — SYNTHROID 150 MCG PO TABS: ORAL_TABLET | ORAL | 0 refills | Status: DC

## 2022-09-19 NOTE — Telephone Encounter (Signed)
Contact patient to schedule appointment. Refill has been sent.

## 2022-10-03 ENCOUNTER — Ambulatory Visit (INDEPENDENT_AMBULATORY_CARE_PROVIDER_SITE_OTHER): Payer: Commercial Managed Care - PPO

## 2022-10-03 ENCOUNTER — Ambulatory Visit (INDEPENDENT_AMBULATORY_CARE_PROVIDER_SITE_OTHER): Payer: Commercial Managed Care - PPO | Admitting: Podiatry

## 2022-10-03 ENCOUNTER — Other Ambulatory Visit: Payer: Self-pay | Admitting: Podiatry

## 2022-10-03 ENCOUNTER — Encounter: Payer: Self-pay | Admitting: Podiatry

## 2022-10-03 DIAGNOSIS — M778 Other enthesopathies, not elsewhere classified: Secondary | ICD-10-CM

## 2022-10-03 MED ORDER — TRIAMCINOLONE ACETONIDE 40 MG/ML IJ SUSP
20.0000 mg | Freq: Once | INTRAMUSCULAR | Status: AC
  Administered 2022-10-03: 20 mg

## 2022-10-03 NOTE — Progress Notes (Signed)
Subjective:  Patient ID: Lori Carney, female    DOB: [birth year],  MRN: [id number] HPI Chief Complaint  Patient presents with   Foot Pain    Dorsal midfoot right - aching x 1 year, worsening, no injury, no treatment   New Patient (Initial Visit)    Est pt 06/2019    61 y.o. female presents with the above complaint.   ROS: Denies fever chills nausea vomiting muscle aches pains calf pain back pain chest pain shortness of breath.  Past Medical History:  Diagnosis Date   Anemia    Arthritis    COVID 04/2021   Depression    rx at same time as thyroid  on meds for over 20 years had some relpase when tried to go off feels normal on medication   Fibroid    Hx of abnormal cervical Pap smear    rx cryo and leep in past   Osteopenia    STD (sexually transmitted disease)    HPV   Thyroid disease    hypothyroidism hashimotos    Vitamin D deficiency    Past Surgical History:  Procedure Laterality Date   AUGMENTATION MAMMAPLASTY     bilateralrectro pectoral saline 2006   BREAST SURGERY     cysts removed from Lt and Rt. breasts-benign    Current Outpatient Medications:    ALPRAZolam (XANAX) 0.25 MG tablet, SMARTSIG:1-2 Tablet(s) By Mouth 1-3 Times Daily PRN, Disp: , Rfl:    Ascorbic Acid (VITAMIN C) 1000 MG tablet, Take 1,000 mg by mouth daily., Disp: , Rfl:    buPROPion (WELLBUTRIN XL) 300 MG 24 hr tablet, Take 1 tablet by mouth daily., Disp: , Rfl: 3   cholecalciferol (VITAMIN D) 1000 units tablet, Take 3,000 Units by mouth daily., Disp: , Rfl:    ferrous sulfate 325 (65 FE) MG EC tablet, Take 325 mg by mouth daily., Disp: , Rfl:    sertraline (ZOLOFT) 100 MG tablet, Take 100 mg by mouth every morning., Disp: , Rfl:    SYNTHROID 150 MCG tablet, TAKE ONE TABLET MONDAY THRU SATURDAY (NO SYNTHROID ON SUNDAY), Disp: 78 tablet, Rfl: 0   vitamin B-12 (CYANOCOBALAMIN) 1000 MCG tablet, Take 1,000 mcg by mouth daily., Disp: , Rfl:    zolpidem (AMBIEN) 5 MG tablet, Take 5-10 mg by mouth at  bedtime., Disp: , Rfl:   Allergies  Allergen Reactions   Macrobid [Nitrofurantoin Macrocrystal] Nausea And Vomiting   Review of Systems Objective:  There were no vitals filed for this visit.  General: Well developed, nourished, in no acute distress, alert and oriented x3   Dermatological: Skin is warm, dry and supple bilateral. Nails x 10 are well maintained; remaining integument appears unremarkable at this time. There are no open sores, no preulcerative lesions, no rash or signs of infection present.  Vitiligo bilateral  Vascular: Dorsalis Pedis artery and Posterior Tibial artery pedal pulses are 2/4 bilateral with immedate capillary fill time. Pedal hair growth present. No varicosities and no lower extremity edema present bilateral.   Neruologic: Grossly intact via light touch bilateral. Vibratory intact via tuning fork bilateral. Protective threshold with Semmes Wienstein monofilament intact to all pedal sites bilateral. Patellar and Achilles deep tendon reflexes 2+ bilateral. No Babinski or clonus noted bilateral.   Musculoskeletal: No gross boney pedal deformities bilateral. No pain, crepitus, or limitation noted with foot and ankle range of motion bilateral. Muscular strength 5/5 in all groups tested bilateral.  She has pain across the dorsal aspect of the tarsometatarsal joints with  radiating pain from the deep peroneal nerve down to the first and second digits of the right foot.  Gait: Unassisted, Nonantalgic.    Radiographs:  Evaluation of the area in question today demonstrates osteoarthritic changes of the TMT joints no dorsal spurring is noted.  She has hardware retained to the second toe and the second metatarsal.  This is intact and in good position.  Slight dorsiflexion of the second toe.  Assessment & Plan:   Assessment: Capsulitis of the second tarsometatarsal joint with deep peroneal nerve neuritis.  Plan: I injected Kenalog and local anesthetic across the dorsal  aspect of the right foot at the point of maximal tenderness.  She tolerated procedure well without complications total of 10 mg of Kenalog was utilized with 5 mg of local anesthetic.  Follow-up with her as needed       [place] [address]. [place], [place]

## 2022-10-11 ENCOUNTER — Telehealth: Payer: Self-pay

## 2022-10-11 NOTE — Telephone Encounter (Signed)
Patient advised and verbalized understanding 

## 2022-10-11 NOTE — Telephone Encounter (Signed)
Patient states that she has Hashimoto disease  and would like to know if she can be prescribed Ruxolitinib topical cream for Vitiligo.

## 2022-10-15 ENCOUNTER — Telehealth: Payer: Self-pay | Admitting: Obstetrics and Gynecology

## 2022-10-15 ENCOUNTER — Ambulatory Visit (INDEPENDENT_AMBULATORY_CARE_PROVIDER_SITE_OTHER): Payer: Commercial Managed Care - PPO | Admitting: Obstetrics and Gynecology

## 2022-10-15 ENCOUNTER — Encounter: Payer: Self-pay | Admitting: Obstetrics and Gynecology

## 2022-10-15 VITALS — BP 128/80 | HR 68 | Ht 64.0 in | Wt 149.0 lb

## 2022-10-15 DIAGNOSIS — K5900 Constipation, unspecified: Secondary | ICD-10-CM

## 2022-10-15 DIAGNOSIS — M549 Dorsalgia, unspecified: Secondary | ICD-10-CM

## 2022-10-15 DIAGNOSIS — R140 Abdominal distension (gaseous): Secondary | ICD-10-CM

## 2022-10-15 DIAGNOSIS — R109 Unspecified abdominal pain: Secondary | ICD-10-CM

## 2022-10-15 NOTE — Telephone Encounter (Signed)
Please schedule pelvic ultrasound - transabdominal and transvaginal at [facility].   My patient has abdominal bloating, constipation, and back pain.   Thank you.

## 2022-10-15 NOTE — Progress Notes (Signed)
GYNECOLOGY  VISIT   HPI: 61 y.o.   Single  Caucasian  female   G2P2002 with Patient's last menstrual period was 12/03/2011.   here for bloating, constipation, and back pain  Taking colace 6 times a day.   Colonoscopy done recently and was normal.  Due again in 10 years.   Some difficulty having BMs. Can have a little bleeding with straining.   No vaginal bleeding.   Does not feel well.  Fatigue and tires easily.   Stopped drinking ETOH.   No dysuria.   Thyroid function has been ok, last tested 6 months ago.   PCP - Dr. Ernie Hew. Endocrinology - Dr. Kelton Pillar.   GYNECOLOGIC HISTORY: Patient's last menstrual period was 12/03/2011. Contraception:  post menopausal Menopausal hormone therapy:  n/a Last mammogram:  09/28/21 BI-RADS CATEGORY 1 NEG Last pap smear:   05/13/19 Neg:Neg HR HPV, 04-08-18 Neg:Neg HR HPV, 04-07-17 Neg:Neg HR HPV         OB History     Gravida  2   Para  2   Term  2   Preterm      AB      Living  2      SAB      IAB      Ectopic      Multiple      Live Births                 Patient Active Problem List   Diagnosis Date Noted   Low serum vitamin B12 05/30/2021   Hashimoto's thyroiditis 05/30/2021   Calcific bursitis of shoulder 01/28/2018   Allergic rhinitis 05/09/2014   Joint pain 05/09/2014   Preventive measure 05/09/2014   Hashimoto's disease 04/06/2013   Vitiligo 04/06/2013   FHx: celiac disease 04/06/2013    Past Medical History:  Diagnosis Date   Anemia    Arthritis    COVID 04/2021   Depression    rx at same time as thyroid  on meds for over 20 years had some relpase when tried to go off feels normal on medication   Fibroid    Hx of abnormal cervical Pap smear    rx cryo and leep in past   Osteopenia    STD (sexually transmitted disease)    HPV   Thyroid disease    hypothyroidism hashimotos    Vitamin D deficiency     Past Surgical History:  Procedure Laterality Date   AUGMENTATION MAMMAPLASTY      bilateralrectro pectoral saline 2006   BREAST SURGERY     cysts removed from Lt and Rt. breasts-benign    Current Outpatient Medications  Medication Sig Dispense Refill   ALPRAZolam (XANAX) 0.25 MG tablet SMARTSIG:1-2 Tablet(s) By Mouth 1-3 Times Daily PRN     Ascorbic Acid (VITAMIN C) 1000 MG tablet Take 1,000 mg by mouth daily.     buPROPion (WELLBUTRIN XL) 300 MG 24 hr tablet Take 1 tablet by mouth daily.  3   cholecalciferol (VITAMIN D) 1000 units tablet Take 3,000 Units by mouth daily.     sertraline (ZOLOFT) 100 MG tablet Take 100 mg by mouth every morning.     SYNTHROID 150 MCG tablet TAKE ONE TABLET MONDAY THRU SATURDAY (NO SYNTHROID ON SUNDAY) 78 tablet 0   ferrous sulfate 325 (65 FE) MG EC tablet Take 325 mg by mouth daily. (Patient not taking: Reported on 10/15/2022)     vitamin B-12 (CYANOCOBALAMIN) 1000 MCG tablet Take 1,000 mcg by mouth  daily. (Patient not taking: Reported on 10/15/2022)     zolpidem (AMBIEN) 5 MG tablet Take 5-10 mg by mouth at bedtime. (Patient not taking: Reported on 10/15/2022)     No current facility-administered medications for this visit.     ALLERGIES: Macrobid [nitrofurantoin macrocrystal]  Family History  Problem Relation Age of Onset   Breast cancer Mother        about age 68   Arthritis Father    Cancer Father        prostate   Celiac disease Sister    Breast cancer Paternal Grandmother     Social History   Socioeconomic History   Marital status: Single    Spouse name: Not on file   Number of children: Not on file   Years of education: Not on file   Highest education level: Not on file  Occupational History   Not on file  Tobacco Use   Smoking status: Never   Smokeless tobacco: Never  Vaping Use   Vaping Use: Never used  Substance and Sexual Activity   Alcohol use: Yes    Alcohol/week: 7.0 standard drinks of alcohol    Types: 7 Standard drinks or equivalent per week   Drug use: No   Sexual activity: Not Currently     Partners: Male    Birth control/protection: Post-menopausal  Other Topics Concern   Not on file  Social History Narrative   Regular exercise: seldom   Caffeine use: tea daily   Single porig from [place] parents in [place] has lived [place] and  [place]  In [place] for about 2-3 years.    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[employer]         Social Determinants of Health   Financial Resource Strain: Not on file  Food Insecurity: Not on file  Transportation Needs: Not on file  Physical Activity: Not on file  Stress: Not on file  Social Connections: Not on file  Intimate Partner Violence: Not on file    Review of Systems  Gastrointestinal:  Positive for constipation.  Musculoskeletal:  Positive for back pain.  Psychiatric/Behavioral:  Positive for agitation.     PHYSICAL EXAMINATION:    BP 128/80 (BP Location: Right Arm, Patient Position: Sitting, Cuff Size: Normal)   Pulse 68   Ht '5\' 4"'$  (1.626 m)   Wt 149 lb (67.6 kg)   LMP 12/03/2011   BMI 25.58 kg/m     General appearance: alert, cooperative and appears stated age Abdomen: soft, non-tender, no masses,  no organomegaly No abnormal inguinal nodes palpated  Pelvic: External genitalia:  no lesions              Urethra:  normal appearing urethra with no masses, tenderness or lesions              Bartholins and Skenes: normal                 Vagina: normal appearing vagina with normal color and discharge, no lesions              Cervix: no lesions                Bimanual Exam:  Uterus:  normal size, contour, position, consistency, mobility, non-tender              Adnexa: no mass, fullness,  tenderness     Chaperone was present for exam:  Kimlexis  ASSESSMENT  Abdominal bloating.  Abdominal pain.  Back pain.  Constipation.   PLAN  Urinalysis;  sg 1.020, pH 5.5, 0 - 5 WBC, 10 - 20 RBC, moderate bacteria, hyaline casts,  moderate mucous.  UC sent No abx now.  Pelvic US at [facility].  She will see endocrinology for a recheck of her thyroid.   An After Visit Summary was printed and given to the patient.  22 min  total time was spent for this patient encounter, including preparation, face-to-face counseling with the patient, coordination of care, and documentation of the encounter.

## 2022-10-16 NOTE — Telephone Encounter (Signed)
I called [facility] to confirm the correct order is being placed for the below. I was told that is the correct order.   Order placed, patient informed that they will call her to schedule.

## 2022-10-17 LAB — URINALYSIS, COMPLETE W/RFL CULTURE
Glucose, UA: NEGATIVE
Leukocyte Esterase: NEGATIVE
Nitrites, Initial: NEGATIVE
Protein, ur: NEGATIVE
Specific Gravity, Urine: 1.02 (ref 1.001–1.035)
pH: 5.5 (ref 5.0–8.0)

## 2022-10-17 LAB — URINE CULTURE
MICRO NUMBER:: 14186433
SPECIMEN QUALITY:: ADEQUATE

## 2022-10-17 LAB — CULTURE INDICATED

## 2022-10-21 ENCOUNTER — Telehealth: Payer: Self-pay | Admitting: Internal Medicine

## 2022-10-21 NOTE — Telephone Encounter (Signed)
Patient is calling to say that she would like to get her labs checked.  Patient states that she has not been feeling right and her Primary care physician is recommending that she get her thyroid levels checked.  Do you want her scheduled and if so how soon?

## 2022-10-21 NOTE — Telephone Encounter (Signed)
Patient scheduled on 10/29/22 at [facility].

## 2022-10-21 NOTE — Telephone Encounter (Signed)
If we have a sooner appointment or a cancellation we can put her in slot

## 2022-10-21 NOTE — Telephone Encounter (Signed)
Patient is scheduled for 10/30/2022 at 8:30 AM.

## 2022-10-21 NOTE — Telephone Encounter (Signed)
Encounter reviewed and closed.  

## 2022-10-29 ENCOUNTER — Ambulatory Visit
Admission: RE | Admit: 2022-10-29 | Discharge: 2022-10-29 | Disposition: A | Discharge status: Home / Self Care | Payer: Commercial Managed Care - PPO | Source: Ambulatory Visit | Attending: Obstetrics and Gynecology | Admitting: Obstetrics and Gynecology

## 2022-10-29 DIAGNOSIS — R140 Abdominal distension (gaseous): Secondary | ICD-10-CM

## 2022-10-29 DIAGNOSIS — K5900 Constipation, unspecified: Secondary | ICD-10-CM

## 2022-10-29 DIAGNOSIS — M549 Dorsalgia, unspecified: Secondary | ICD-10-CM

## 2022-10-30 ENCOUNTER — Encounter: Payer: Self-pay | Admitting: Internal Medicine

## 2022-10-30 ENCOUNTER — Ambulatory Visit (INDEPENDENT_AMBULATORY_CARE_PROVIDER_SITE_OTHER): Payer: Commercial Managed Care - PPO | Admitting: Internal Medicine

## 2022-10-30 VITALS — BP 100/60 | HR 72 | Ht 64.0 in | Wt 150.0 lb

## 2022-10-30 DIAGNOSIS — E063 Autoimmune thyroiditis: Secondary | ICD-10-CM

## 2022-10-30 LAB — TSH: TSH: 4.15 u[IU]/mL (ref 0.35–5.50)

## 2022-10-30 LAB — T4, FREE: Free T4: 0.72 ng/dL (ref 0.60–1.60)

## 2022-10-30 MED ORDER — SERTRALINE HCL 100 MG PO TABS: 100.0000 mg | ORAL_TABLET | Freq: Every morning | ORAL | Status: AC

## 2022-10-30 MED ORDER — SYNTHROID 150 MCG PO TABS: 150.0000 ug | ORAL_TABLET | Freq: Every day | ORAL | 2 refills | Status: DC

## 2022-10-30 NOTE — Progress Notes (Signed)
Name: Lori Carney  MRN/ DOB: [id number], [birth year]    Age/ Sex: 61 y.o., female     PCP: Fanny Bien, MD   Reason for Endocrinology Evaluation: Hashimoto's Thyroiditis     Initial Endocrinology Clinic Visit: 01/03/2021    PATIENT IDENTIFIER: Lori Carney is a 61 y.o., female with a past medical history of Hashimoto's Thyroiditis and Vitiligo. Lori Carney has followed with [facility] since 01/03/2021 for consultative assistance with management of her Hashimoto's Thyroiditis .   HISTORICAL SUMMARY:   Lori Carney has been diagnosed with hypothyroidism in 1989 ,right after giving birth and was started on LT-4 replacement at the time.    Lori Carney was on synthroid until recently when it was switched to generic with alternating 137 and 150 mcg since September, 2021   Of note, the pt has a history of Vitiligo since 1970  On her initial visit to our clinic Lori Carney had a TSH of 0.14 uIU/Ml and we reduced Synthroid dose    Sisters with celiac disease. No FH of thyroid disease    SUBJECTIVE:    Today (10/30/2022):  Lori Carney is here for Hashimoto's Thyroiditis .  Has chronic constipation - takes 6 colace a day  No local neck swelling  Denies palpitations Denies tremors  Lori Carney has not been feeling well at all feeling tired , has noted weight gain   NO Biotin    Synthroid 150 mcg ,1 tablet daily except Sunday ( no synthroid on sundays )      HISTORY:  Past Medical History:  Past Medical History:  Diagnosis Date   Anemia    Arthritis    COVID 04/2021   Depression    rx at same time as thyroid  on meds for over 20 years had some relpase when tried to go off feels normal on medication   Fibroid    Hx of abnormal cervical Pap smear    rx cryo and leep in past   Osteopenia    STD (sexually transmitted disease)    HPV   Thyroid disease    hypothyroidism hashimotos    Vitamin D deficiency    Past Surgical History:  Past Surgical History:  Procedure Laterality  Date   AUGMENTATION MAMMAPLASTY     bilateralrectro pectoral saline 2006   BREAST SURGERY     cysts removed from Lt and Rt. breasts-benign   Social History:  reports that Lori Carney has never smoked. Lori Carney has never used smokeless tobacco. Lori Carney reports current alcohol use of about 7.0 standard drinks of alcohol per week. Lori Carney reports that Lori Carney does not use drugs. Family History:  Family History  Problem Relation Age of Onset   Breast cancer Mother        about age 54   Arthritis Father    Cancer Father        prostate   Celiac disease Sister    Breast cancer Paternal Grandmother      HOME MEDICATIONS: Allergies as of 10/30/2022       Reactions   Macrobid [nitrofurantoin Macrocrystal] Nausea And Vomiting        Medication List        Accurate as of October 30, 2022  3:18 PM. If you have any questions, ask your nurse or doctor.          STOP taking these medications    zolpidem 5 MG tablet Commonly known as: AMBIEN Stopped by: Dorita Sciara, MD       TAKE  these medications    ALPRAZolam 0.25 MG tablet Commonly known as: XANAX SMARTSIG:1-2 Tablet(s) By Mouth 1-3 Times Daily PRN   buPROPion 300 MG 24 hr tablet Commonly known as: WELLBUTRIN XL Take 1 tablet by mouth daily.   cholecalciferol 1000 units tablet Commonly known as: VITAMIN D Take 3,000 Units by mouth daily.   cyanocobalamin 1000 MCG tablet Commonly known as: VITAMIN B12 Take 1,000 mcg by mouth daily.   ferrous sulfate 325 (65 FE) MG EC tablet Take 325 mg by mouth daily.   sertraline 100 MG tablet Commonly known as: ZOLOFT Take 1 tablet (100 mg total) by mouth every morning.   Synthroid 150 MCG tablet Generic drug: levothyroxine TAKE ONE TABLET MONDAY THRU SATURDAY (NO SYNTHROID ON SUNDAY)   vitamin C 1000 MG tablet Take 1,000 mg by mouth daily.          OBJECTIVE:   PHYSICAL EXAM: VS: BP 100/60 (BP Location: Left Arm, Patient Position: Sitting, Cuff Size: Small)   Pulse 72    Ht '5\' 4"'$  (1.626 m)   Wt 150 lb (68 kg)   LMP 12/03/2011   SpO2 99%   BMI 25.75 kg/m    EXAM: General: Pt appears well and is in NAD  Neck: General: Supple without adenopathy. Thyroid: Thyroid size normal.  No goiter or nodules appreciated. No thyroid bruit.  Lungs: Clear with good BS bilat with no rales, rhonchi, or wheezes  Heart: Auscultation: RRR.  Abdomen: Normoactive bowel sounds, soft, nontender, without masses or organomegaly palpable  Extremities:  BL LE: No pretibial edema normal ROM and strength.  Mental Status: Judgment, insight: Intact Orientation: Oriented to time, place, and person Mood and affect: No depression, anxiety, or agitation     DATA REVIEWED:   Latest Reference Range & Units 10/30/22 08:55  TSH 0.35 - 5.50 uIU/mL 4.15  T4,Free(Direct) 0.60 - 1.60 ng/dL 0.72      ASSESSMENT / PLAN / RECOMMENDATIONS:   Hashimoto's Thyroiditis:  -Patient with fatigue and weight gain  -No local neck symptoms -TSH is is elevated, will increase Synthroid as below   Medications   Take Synthroid 150 MCG Monday through Saturday (skip Sunday)  2. Fatigue   - Lori Carney is not sleeping well  - Last year Vit B12 was on low normal, and is not on MVI.  - Pt advised  to start MVI daily   Follow-up in 6 months  Signed electronically by: Mack Guise, MD  [facility]  [address] [place], [place] [address] Phone: [phone] FAX: [phone]      CC: Fanny Bien, MD [address] [place] [address] Phone: [phone]  Fax: [phone]   Return to Endocrinology clinic as below: Future Appointments  Date Time Provider [place]  04/30/2023  8:30 AM German Manke, Melanie Crazier, MD [id number] None

## 2022-10-30 NOTE — Patient Instructions (Signed)

## 2022-10-31 ENCOUNTER — Encounter: Payer: Self-pay | Admitting: Internal Medicine

## 2022-12-03 ENCOUNTER — Ambulatory Visit: Payer: 59 | Admitting: Internal Medicine

## 2023-01-23 ENCOUNTER — Telehealth: Payer: Self-pay

## 2023-01-23 NOTE — Telephone Encounter (Signed)
Please schedule her an appointment with Dr Quincy Simmonds

## 2023-01-23 NOTE — Telephone Encounter (Signed)
BS pt calling to report noticing dark spotty color in urine since starting new medication for vitiligo ~4 days ago (Rinvoq). Denies any pelvic pain/discomfort. Reports dermatologist advising her to stop taking medication and they tested her urine and was negative for RBCs so assume that blood is likely coming from vagina and advised pt to contact us or her PCP. Pt advised that an office visit for an evaluation will likely be the outcome but will forward to provider for confirmation. Please advise.

## 2023-01-24 NOTE — Telephone Encounter (Signed)
Pt notified and voiced understanding. Will send msg to appt desk.

## 2023-01-24 NOTE — Telephone Encounter (Signed)
FYI. Pt scheduled for 02/03/2023. Will close encounter and route to provider for final review.

## 2023-01-27 NOTE — Progress Notes (Deleted)
GYNECOLOGY  VISIT   HPI: 61 y.o.   Single  Caucasian  female   G2P2002 with Patient's last menstrual period was 12/03/2011.   here for   PMB  GYNECOLOGIC HISTORY: Patient's last menstrual period was 12/03/2011. Contraception:  PMP Menopausal hormone therapy:  n/a Last mammogram:  09/28/21 BI-RADS CATEGORY 1 NEG  Last pap smear:   05/13/19 Neg:Neg HR HPV, 04-08-18 Neg:Neg HR HPV, 04-07-17 Neg:Neg HR HPV         OB History     Gravida  2   Para  2   Term  2   Preterm      AB      Living  2      SAB      IAB      Ectopic      Multiple      Live Births                 Patient Active Problem List   Diagnosis Date Noted   Low serum vitamin B12 05/30/2021   Hashimoto's thyroiditis 05/30/2021   Calcific bursitis of shoulder 01/28/2018   Allergic rhinitis 05/09/2014   Joint pain 05/09/2014   Preventive measure 05/09/2014   Hashimoto's disease 04/06/2013   Vitiligo 04/06/2013   FHx: celiac disease 04/06/2013    Past Medical History:  Diagnosis Date   Anemia    Arthritis    COVID 04/2021   Depression    rx at same time as thyroid  on meds for over 20 years had some relpase when tried to go off feels normal on medication   Fibroid    Hx of abnormal cervical Pap smear    rx cryo and leep in past   Osteopenia    STD (sexually transmitted disease)    HPV   Thyroid disease    hypothyroidism hashimotos    Vitamin D deficiency     Past Surgical History:  Procedure Laterality Date   AUGMENTATION MAMMAPLASTY     bilateralrectro pectoral saline 2006   BREAST SURGERY     cysts removed from Lt and Rt. breasts-benign    Current Outpatient Medications  Medication Sig Dispense Refill   ALPRAZolam (XANAX) 0.25 MG tablet SMARTSIG:1-2 Tablet(s) By Mouth 1-3 Times Daily PRN     Ascorbic Acid (VITAMIN C) 1000 MG tablet Take 1,000 mg by mouth daily.     buPROPion (WELLBUTRIN XL) 300 MG 24 hr tablet Take 1 tablet by mouth daily.  3   cholecalciferol (VITAMIN D) 1000  units tablet Take 3,000 Units by mouth daily.     ferrous sulfate 325 (65 FE) MG EC tablet Take 325 mg by mouth daily. (Patient not taking: Reported on 10/30/2022)     OPZELURA 1.5 % CREA Apply topically.     sertraline (ZOLOFT) 100 MG tablet Take 1 tablet (100 mg total) by mouth every morning.     SYNTHROID 150 MCG tablet Take 1 tablet (150 mcg total) by mouth daily before breakfast. 90 tablet 2   vitamin B-12 (CYANOCOBALAMIN) 1000 MCG tablet Take 1,000 mcg by mouth daily. (Patient not taking: Reported on 10/30/2022)     No current facility-administered medications for this visit.     ALLERGIES: Macrobid [nitrofurantoin macrocrystal]  Family History  Problem Relation Age of Onset   Breast cancer Mother        about age 45   Arthritis Father    Cancer Father        prostate   Celiac  disease Sister    Breast cancer Paternal Grandmother     Social History   Socioeconomic History   Marital status: Single    Spouse name: Not on file   Number of children: Not on file   Years of education: Not on file   Highest education level: Not on file  Occupational History   Not on file  Tobacco Use   Smoking status: Never   Smokeless tobacco: Never  Vaping Use   Vaping Use: Never used  Substance and Sexual Activity   Alcohol use: Yes    Alcohol/week: 7.0 standard drinks of alcohol    Types: 7 Standard drinks or equivalent per week   Drug use: No   Sexual activity: Not Currently    Partners: Male    Birth control/protection: Post-menopausal  Other Topics Concern   Not on file  Social History Narrative   Regular exercise: seldom   Caffeine use: tea daily   Single porig from [place] parents in [place] has lived [place] and  [place]  In [place] for about 2-3 years.    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[employer]         Social Determinants of Health   Financial Resource Strain: Not on file   Food Insecurity: Not on file  Transportation Needs: Not on file  Physical Activity: Not on file  Stress: Not on file  Social Connections: Not on file  Intimate Partner Violence: Not on file    Review of Systems  PHYSICAL EXAMINATION:    LMP 12/03/2011     General appearance: alert, cooperative and appears stated age Head: Normocephalic, without obvious abnormality, atraumatic Neck: no adenopathy, supple, symmetrical, trachea midline and thyroid normal to inspection and palpation Lungs: clear to auscultation bilaterally Breasts: normal appearance, no masses or tenderness, No nipple retraction or dimpling, No nipple discharge or bleeding, No axillary or supraclavicular adenopathy Heart: regular rate and rhythm Abdomen: soft, non-tender, no masses,  no organomegaly Extremities: extremities normal, atraumatic, no cyanosis or edema Skin: Skin color, texture, turgor normal. No rashes or lesions Lymph nodes: Cervical, supraclavicular, and axillary nodes normal. No abnormal inguinal nodes palpated Neurologic: Grossly normal  Pelvic: External genitalia:  no lesions              Urethra:  normal appearing urethra with no masses, tenderness or lesions              Bartholins and Skenes: normal                 Vagina: normal appearing vagina with normal color and discharge, no lesions              Cervix: no lesions                Bimanual Exam:  Uterus:  normal size, contour, position, consistency, mobility, non-tender              Adnexa: no mass, fullness, tenderness              Rectal exam: {yes no:[id number]}.  Confirms.              Anus:  normal sphincter tone, no lesions  Chaperone was present for exam:  ***  ASSESSMENT     PLAN     An After Visit Summary was printed and given to the patient.  ______ minutes face to face time of which over 50% was spent in counseling.

## 2023-02-03 ENCOUNTER — Ambulatory Visit: Payer: Commercial Managed Care - PPO | Admitting: Obstetrics and Gynecology

## 2023-04-30 ENCOUNTER — Ambulatory Visit (INDEPENDENT_AMBULATORY_CARE_PROVIDER_SITE_OTHER): Payer: Commercial Managed Care - PPO | Admitting: Internal Medicine

## 2023-04-30 ENCOUNTER — Encounter: Payer: Self-pay | Admitting: Internal Medicine

## 2023-04-30 VITALS — BP 104/68 | HR 65 | Ht 64.0 in | Wt 148.0 lb

## 2023-04-30 DIAGNOSIS — E063 Autoimmune thyroiditis: Secondary | ICD-10-CM | POA: Diagnosis not present

## 2023-04-30 DIAGNOSIS — E538 Deficiency of other specified B group vitamins: Secondary | ICD-10-CM

## 2023-04-30 LAB — VITAMIN D 25 HYDROXY (VIT D DEFICIENCY, FRACTURES): VITD: 44.7 ng/mL (ref 30.00–100.00)

## 2023-04-30 LAB — VITAMIN B12: Vitamin B-12: 203 pg/mL — ABNORMAL LOW (ref 211–911)

## 2023-04-30 LAB — TSH: TSH: 0.03 u[IU]/mL — ABNORMAL LOW (ref 0.35–5.50)

## 2023-04-30 MED ORDER — SYNTHROID 137 MCG PO TABS: 137.0000 ug | ORAL_TABLET | Freq: Every day | ORAL | 3 refills | Status: DC

## 2023-04-30 MED ORDER — LEVOTHYROXINE SODIUM 137 MCG PO TABS: 137.0000 ug | ORAL_TABLET | Freq: Every day | ORAL | 3 refills | Status: DC

## 2023-04-30 NOTE — Patient Instructions (Addendum)
Start Multivitamin 1 tablet daily , please make sure you separate Vitamins by 4 hours from Synthroid

## 2023-04-30 NOTE — Progress Notes (Signed)
Name: Lori Carney  MRN/ DOB: [id number], May 31, 1961    Age/ Sex: 62 y.o., female     PCP: Lewis Moccasin, MD   Reason for Endocrinology Evaluation: Hashimoto's Thyroiditis     Initial Endocrinology Clinic Visit: 01/03/2021    PATIENT IDENTIFIER: Lori Carney is a 63 y.o., female with a past medical history of Hashimoto's Thyroiditis and Vitiligo. She has followed with [facility] since 01/03/2021 for consultative assistance with management of her Hashimoto's Thyroiditis .   HISTORICAL SUMMARY:   She has been diagnosed with hypothyroidism in 1989 ,right after giving birth and was started on LT-4 replacement at the time.    She was on synthroid until recently when it was switched to generic with alternating 137 and 150 mcg since September, 2021   Of note, the pt has a history of Vitiligo since 1970  On her initial visit to our clinic she had a TSH of 0.14 uIU/Ml and we reduced Synthroid dose    Sisters with celiac disease. No FH of thyroid disease    SUBJECTIVE:    Today (04/30/2023):  Lori Carney is here for Hashimoto's Thyroiditis .  Weight continues to Fluctuate Denies local neck swelling  Has chronic constipation  Denies tremors  She has not noted any changes in energy level  NO Biotin    Synthroid 150 mcg ,1 tablet daily  Vitamin D 800 iu     HISTORY:  Past Medical History:  Past Medical History:  Diagnosis Date   Anemia    Arthritis    COVID 04/2021   Depression    rx at same time as thyroid  on meds for over 20 years had some relpase when tried to go off feels normal on medication   Fibroid    Hx of abnormal cervical Pap smear    rx cryo and leep in past   Osteopenia    STD (sexually transmitted disease)    HPV   Thyroid disease    hypothyroidism hashimotos    Vitamin D deficiency    Past Surgical History:  Past Surgical History:  Procedure Laterality Date   AUGMENTATION MAMMAPLASTY     bilateralrectro pectoral saline  2006   BREAST SURGERY     cysts removed from Lt and Rt. breasts-benign   Social History:  reports that she has never smoked. She has never used smokeless tobacco. She reports current alcohol use of about 7.0 standard drinks of alcohol per week. She reports that she does not use drugs. Family History:  Family History  Problem Relation Age of Onset   Breast cancer Mother        about age 52   Arthritis Father    Cancer Father        prostate   Celiac disease Sister    Breast cancer Paternal Grandmother      HOME MEDICATIONS: Allergies as of 04/30/2023       Reactions   Macrobid [nitrofurantoin Macrocrystal] Nausea And Vomiting        Medication List        Accurate as of Apr 30, 2023  8:45 AM. If you have any questions, ask your nurse or doctor.          STOP taking these medications    cyanocobalamin 1000 MCG tablet Commonly known as: VITAMIN B12 Stopped by: Scarlette Shorts, MD       TAKE these medications    ALPRAZolam 0.25 MG tablet Commonly known asPrudy Feeler SMARTSIG:1-2  Tablet(s) By Mouth 1-3 Times Daily PRN   buPROPion 300 MG 24 hr tablet Commonly known as: WELLBUTRIN XL Take 1 tablet by mouth daily.   cholecalciferol 1000 units tablet Commonly known as: VITAMIN D Take 3,000 Units by mouth daily.   ferrous sulfate 325 (65 FE) MG EC tablet Take 325 mg by mouth daily.   Opzelura 1.5 % Crea Generic drug: Ruxolitinib Phosphate Apply topically.   sertraline 100 MG tablet Commonly known as: ZOLOFT Take 1 tablet (100 mg total) by mouth every morning.   Synthroid 150 MCG tablet Generic drug: levothyroxine Take 1 tablet (150 mcg total) by mouth daily before breakfast.   vitamin C 1000 MG tablet Take 1,000 mg by mouth daily.          OBJECTIVE:   PHYSICAL EXAM: VS: BP 104/68 (BP Location: Left Arm, Patient Position: Sitting, Cuff Size: Small)   Pulse 65   Ht 5\' 4"  (1.626 m)   Wt 148 lb (67.1 kg)   LMP 12/03/2011   SpO2 99%   BMI  25.40 kg/m    EXAM: General: Pt appears well and is in NAD  Neck: General: Supple without adenopathy. Thyroid: Thyroid size normal.  No goiter or nodules appreciated. No thyroid bruit.  Lungs: Clear with good BS bilat   Heart: Auscultation: RRR.  Abdomen: Soft, nontender  Extremities:  BL LE: No pretibial edema norma, vitiligo normal .  Mental Status: Judgment, insight: Intact Orientation: Oriented to time, place, and person Mood and affect: No depression, anxiety, or agitation     DATA REVIEWED:   Latest Reference Range & Units 04/30/23 09:03  VITD 30.00 - 100.00 ng/mL 44.70  Vitamin B12 211 - 911 pg/mL 203 (L)  TSH 0.35 - 5.50 uIU/mL 0.03 (L)  (L): Data is abnormally low    ASSESSMENT / PLAN / RECOMMENDATIONS:   Hashimoto's Thyroiditis:   -TSH r continues to fluctuate, and has increased from 4.15 uIU/mL to 0.03 uIU/mL  -Will decrease Synthroid as below - Pt educated extensively on the correct way to take levothyroxine (first thing in the morning with water, 30 minutes before eating or taking other medications). - Pt encouraged to double dose the following day if she were to miss a dose given long half-life of levothyroxine.    Medications   Stop Synthroid 150 MCG  Start Synthroid 137 mcg daily   2.  Vitamin B12 deficiency -Patient advised to start a multivitamin  Follow-up in 6 months Repeat TSH in 2 months  Signed electronically by: Lyndle Herrlich, MD  [facility]  [facility] [address] [place], [place] [address] Phone: [phone] FAX: [phone]      CC: Lewis Moccasin, MD [address] [place] [address] Phone: [phone]  Fax: [phone]   Return to Endocrinology clinic as below: No future appointments.

## 2023-05-11 ENCOUNTER — Emergency Department (HOSPITAL_BASED_OUTPATIENT_CLINIC_OR_DEPARTMENT_OTHER): Payer: Commercial Managed Care - PPO | Admitting: Radiology

## 2023-05-11 ENCOUNTER — Encounter (HOSPITAL_BASED_OUTPATIENT_CLINIC_OR_DEPARTMENT_OTHER): Payer: Self-pay | Admitting: Emergency Medicine

## 2023-05-11 ENCOUNTER — Other Ambulatory Visit: Payer: Self-pay

## 2023-05-11 ENCOUNTER — Emergency Department (HOSPITAL_BASED_OUTPATIENT_CLINIC_OR_DEPARTMENT_OTHER)
Admission: EM | Admit: 2023-05-11 | Discharge: 2023-05-11 | Disposition: A | Discharge status: Home / Self Care | Payer: Commercial Managed Care - PPO | Attending: Emergency Medicine | Admitting: Emergency Medicine

## 2023-05-11 DIAGNOSIS — E039 Hypothyroidism, unspecified: Secondary | ICD-10-CM | POA: Insufficient documentation

## 2023-05-11 DIAGNOSIS — Z79899 Other long term (current) drug therapy: Secondary | ICD-10-CM | POA: Diagnosis not present

## 2023-05-11 DIAGNOSIS — Z8616 Personal history of COVID-19: Secondary | ICD-10-CM | POA: Insufficient documentation

## 2023-05-11 DIAGNOSIS — R0602 Shortness of breath: Secondary | ICD-10-CM | POA: Diagnosis present

## 2023-05-11 DIAGNOSIS — Z1152 Encounter for screening for COVID-19: Secondary | ICD-10-CM | POA: Diagnosis not present

## 2023-05-11 DIAGNOSIS — J069 Acute upper respiratory infection, unspecified: Secondary | ICD-10-CM | POA: Diagnosis not present

## 2023-05-11 LAB — BASIC METABOLIC PANEL
Anion gap: 11 (ref 5–15)
BUN: 25 mg/dL — ABNORMAL HIGH (ref 8–23)
CO2: 22 mmol/L (ref 22–32)
Calcium: 9.6 mg/dL (ref 8.9–10.3)
Chloride: 105 mmol/L (ref 98–111)
Creatinine, Ser: 1.14 mg/dL — ABNORMAL HIGH (ref 0.44–1.00)
GFR, Estimated: 54 mL/min — ABNORMAL LOW (ref >60)
Glucose, Bld: 94 mg/dL (ref 70–99)
Potassium: 4.1 mmol/L (ref 3.5–5.1)
Sodium: 138 mmol/L (ref 135–145)

## 2023-05-11 LAB — CBC
HCT: 30.9 % — ABNORMAL LOW (ref 36.0–46.0)
Hemoglobin: 10.2 g/dL — ABNORMAL LOW (ref 12.0–15.0)
MCH: 31.1 pg (ref 26.0–34.0)
MCHC: 33 g/dL (ref 30.0–36.0)
MCV: 94.2 fL (ref 80.0–100.0)
Platelets: 291 10*3/uL (ref 150–400)
RBC: 3.28 MIL/uL — ABNORMAL LOW (ref 3.87–5.11)
RDW: 12.8 % (ref 11.5–15.5)
WBC: 4.6 10*3/uL (ref 4.0–10.5)
nRBC: 0 % (ref 0.0–0.2)

## 2023-05-11 LAB — SARS CORONAVIRUS 2 BY RT PCR: SARS Coronavirus 2 by RT PCR: NEGATIVE

## 2023-05-11 MED ORDER — AEROCHAMBER PLUS FLO-VU MEDIUM MISC
1.0000 | Freq: Once | Status: AC
  Administered 2023-05-11: 1
  Filled 2023-05-11: qty 10

## 2023-05-11 MED ORDER — IPRATROPIUM BROMIDE 0.02 % IN SOLN
0.5000 mg | Freq: Once | RESPIRATORY_TRACT | Status: AC
  Administered 2023-05-11: 0.5 mg via RESPIRATORY_TRACT
  Filled 2023-05-11: qty 2.5

## 2023-05-11 MED ORDER — ALBUTEROL SULFATE (2.5 MG/3ML) 0.083% IN NEBU
5.0000 mg | INHALATION_SOLUTION | Freq: Once | RESPIRATORY_TRACT | Status: AC
  Administered 2023-05-11: 5 mg via RESPIRATORY_TRACT
  Filled 2023-05-11: qty 6

## 2023-05-11 MED ORDER — ALBUTEROL SULFATE HFA 108 (90 BASE) MCG/ACT IN AERS
2.0000 | INHALATION_SPRAY | RESPIRATORY_TRACT | Status: DC | PRN
  Administered 2023-05-11: 2 via RESPIRATORY_TRACT
  Filled 2023-05-11: qty 6.7

## 2023-05-11 MED ORDER — DOXYCYCLINE HYCLATE 100 MG PO CAPS: 100.0000 mg | ORAL_CAPSULE | Freq: Two times a day (BID) | ORAL | 0 refills | Status: DC

## 2023-05-11 NOTE — ED Triage Notes (Signed)
Symptoms of URI started Monday. Increased  sob since . Productive cough.head Very sob during triage unable to speak in full sentences,

## 2023-05-11 NOTE — ED Notes (Signed)
Straight stick for blood sample by this RT.

## 2023-05-11 NOTE — ED Provider Notes (Signed)
[place] EMERGENCY DEPARTMENT AT [facility] Provider Note   CSN: [id number] Arrival date & time: 05/11/23  1523     History  Chief Complaint  Patient presents with   Shortness of Breath    Lori Carney is a 62 y.o. female.   Shortness of Breath Patient presents with shortness of breath.  Has had for a week now.  Coughing.  States she felt worse yesterday.  Difficulty walking due to the shortness of breath.  Cough and congestion.  No fevers but has felt bad.  States her lungs have not been the same since getting COVID around a year ago.  Does not smoke.  No known sick contacts.    Past Medical History:  Diagnosis Date   Anemia    Arthritis    COVID 04/2021   Depression    rx at same time as thyroid  on meds for over 20 years had some relpase when tried to go off feels normal on medication   Fibroid    Hx of abnormal cervical Pap smear    rx cryo and leep in past   Osteopenia    STD (sexually transmitted disease)    HPV   Thyroid disease    hypothyroidism hashimotos    Vitamin D deficiency     Home Medications Prior to Admission medications   Medication Sig Start Date End Date Taking? Authorizing Provider  doxycycline (VIBRAMYCIN) 100 MG capsule Take 1 capsule (100 mg total) by mouth 2 (two) times daily. 05/11/23  Yes Benjiman Core, MD  ALPRAZolam Prudy Feeler) 0.25 MG tablet SMARTSIG:1-2 Tablet(s) By Mouth 1-3 Times Daily PRN 08/09/22   [provider]  Ascorbic Acid (VITAMIN C) 1000 MG tablet Take 1,000 mg by mouth daily.    [provider]  buPROPion (WELLBUTRIN XL) 300 MG 24 hr tablet Take 1 tablet by mouth daily. 01/09/17   [provider]  cholecalciferol (VITAMIN D) 1000 units tablet Take 3,000 Units by mouth daily.    [provider]  ferrous sulfate 325 (65 FE) MG EC tablet Take 325 mg by mouth daily.    [provider]  OPZELURA 1.5 % CREA Apply topically. 01/07/23   [provider]  sertraline (ZOLOFT)  100 MG tablet Take 1 tablet (100 mg total) by mouth every morning. 10/30/22   Shamleffer, Konrad Dolores, MD  SYNTHROID 137 MCG tablet Take 1 tablet (137 mcg total) by mouth daily before breakfast. 04/30/23   Shamleffer, Konrad Dolores, MD  Upadacitinib ER (RINVOQ) 15 MG TB24 Take 15 mg by mouth daily in the afternoon.    [provider]      Allergies    Macrobid [nitrofurantoin macrocrystal]    Review of Systems   Review of Systems  Respiratory:  Positive for shortness of breath.     Physical Exam Updated Vital Signs BP 128/78   Pulse 72   Temp 97.9 F (36.6 C) (Oral)   Resp 20   LMP 12/03/2011   SpO2 98%  Physical Exam Vitals reviewed.  HENT:     Head: Normocephalic.  Neck:     Comments: Nasal congestion Pulmonary:     Comments: Tachypnea.  Harsh breath sounds particularly on right side. Musculoskeletal:     Right lower leg: No edema.     Left lower leg: No edema.  Skin:    Capillary Refill: Capillary refill takes less than 2 seconds.  Neurological:     Mental Status: She is alert.     ED  Results / Procedures / Treatments   Labs (all labs ordered are listed, but only abnormal results are displayed) Labs Reviewed  BASIC METABOLIC PANEL - Abnormal; Notable for the following components:      Result Value   BUN 25 (*)    Creatinine, Ser 1.14 (*)    GFR, Estimated 54 (*)    All other components within normal limits  CBC - Abnormal; Notable for the following components:   RBC 3.28 (*)    Hemoglobin 10.2 (*)    HCT 30.9 (*)    All other components within normal limits  SARS CORONAVIRUS 2 BY RT PCR    EKG None  Radiology DG Chest 2 View  Result Date: 05/11/2023 CLINICAL DATA:  Cough and shortness of breath EXAM: CHEST - 2 VIEW COMPARISON:  None Available. FINDINGS: No consolidation, pneumothorax or effusion. No edema. Normal cardiopericardial silhouette with tortuous aorta. Apical pleural thickening. Overlapping cardiac leads. IMPRESSION: No acute  cardiopulmonary disease. Electronically Signed   By: Karen Kays M.D.   On: 05/11/2023 16:09    Procedures Procedures    Medications Ordered in ED Medications  albuterol (PROVENTIL) (2.5 MG/3ML) 0.083% nebulizer solution 5 mg (5 mg Nebulization Given 05/11/23 1554)  ipratropium (ATROVENT) nebulizer solution 0.5 mg (0.5 mg Nebulization Given 05/11/23 1555)  AeroChamber Plus Flo-Vu Medium MISC 1 each (1 each Other Given 05/11/23 1735)    ED Course/ Medical Decision Making/ A&P                             Medical Decision Making Amount and/or Complexity of Data Reviewed Labs: ordered. Radiology: ordered.  Risk Prescription drug management.   Patient shortness of breath and cough.  URI type symptoms.  Reported is have some chronic lung disease since COVID.  Harsh breath sounds on right.  Will get x-ray and basic blood work.  Does have dyspnea.  Will also give breathing treatment and likely ambulate on pulse ox.  Feels better after treatment.  X-ray reassuring.  Creatinine is slightly raised.  However feels much better after treatment and appears stable for discharge home.  Did have harsh breath sounds on the right compared to left.  However will give antibiotics to be filled if patient is not improving in a day or 2.  However appears stable for discharge home.       Final Clinical Impression(s) / ED Diagnoses Final diagnoses:  Upper respiratory tract infection, unspecified type    Rx / DC Orders ED Discharge Orders          Ordered    doxycycline (VIBRAMYCIN) 100 MG capsule  2 times daily        05/11/23 1745              Benjiman Core, MD 05/11/23 2334

## 2023-05-11 NOTE — Discharge Instructions (Addendum)
Your x-ray and blood work was reassuring.  However he did have some different sounds on the right than the left.  I have given you prescription for antibiotics.  If you are not improving in the next couple days you can fill the antibiotics for presumed pneumonia.  Follow-up with your doctor as needed.

## 2023-05-11 NOTE — ED Notes (Signed)
Ms. Sergeant's room air O2 sat while ambulating was 96%

## 2023-08-11 ENCOUNTER — Telehealth: Payer: Self-pay

## 2023-08-11 NOTE — Telephone Encounter (Signed)
Patient has a physical coming up and would like to do labs done at that time. Would like to know if you need additional labs other than the TSH.

## 2023-08-12 NOTE — Telephone Encounter (Signed)
Patient aware.

## 2023-09-09 ENCOUNTER — Encounter: Payer: Self-pay | Admitting: Internal Medicine

## 2023-11-06 NOTE — Progress Notes (Unsigned)
Name: Lori Carney  MRN/ DOB: [id number], 07/08/1961    Age/ Sex: 62 y.o., female     PCP: Lewis Moccasin, MD   Reason for Endocrinology Evaluation: Hashimoto's Thyroiditis     Initial Endocrinology Clinic Visit: 01/03/2021    PATIENT IDENTIFIER: Lori Carney is a 62 y.o., female with a past medical history of Hashimoto's Thyroiditis and Vitiligo. She has followed with [facility] since 01/03/2021 for consultative assistance with management of her Hashimoto's Thyroiditis .   HISTORICAL SUMMARY:   She has been diagnosed with hypothyroidism in 1989 ,right after giving birth and was started on LT-4 replacement at the time.    She was on synthroid until recently when it was switched to generic with alternating 137 and 150 mcg since September, 2021   Of note, the pt has a history of Vitiligo since 1970  On her initial visit to our clinic she had a TSH of 0.14 uIU/Ml and we reduced Synthroid dose    Sisters with celiac disease. No FH of thyroid disease    SUBJECTIVE:    Today (11/07/2023):  Lori Carney is here for Hashimoto's Thyroiditis .  Weight has stable  Takes Levothroid at night  She has been confused and depressed on Wellbutrin and Zoloft Doesn't sleep well at night  Denies local neck swelling  Has chronic constipation  She feels jittery  Denies palpitation  Son is going to seizure unit at [facility]   NO Biotin    Synthroid 150 mcg , half a tablet on Sundays 1 tablet daily     HISTORY:  Past Medical History:  Past Medical History:  Diagnosis Date   Anemia    Arthritis    COVID 04/2021   Depression    rx at same time as thyroid  on meds for over 20 years had some relpase when tried to go off feels normal on medication   Fibroid    Hx of abnormal cervical Pap smear    rx cryo and leep in past   Osteopenia    STD (sexually transmitted disease)    HPV   Thyroid disease    hypothyroidism hashimotos    Vitamin D deficiency    Past  Surgical History:  Past Surgical History:  Procedure Laterality Date   AUGMENTATION MAMMAPLASTY     bilateralrectro pectoral saline 2006   BREAST SURGERY     cysts removed from Lt and Rt. breasts-benign   Social History:  reports that she has never smoked. She has never used smokeless tobacco. She reports current alcohol use of about 7.0 standard drinks of alcohol per week. She reports that she does not use drugs. Family History:  Family History  Problem Relation Age of Onset   Breast cancer Mother        about age 82   Arthritis Father    Cancer Father        prostate   Celiac disease Sister    Breast cancer Paternal Grandmother      HOME MEDICATIONS: Allergies as of 11/07/2023       Reactions   Macrobid [nitrofurantoin Macrocrystal] Nausea And Vomiting   Nitrofurantoin Other (See Comments)        Medication List        Accurate as of November 07, 2023  8:43 AM. If you have any questions, ask your nurse or doctor.          ALPRAZolam 0.25 MG tablet Commonly known as: XANAX SMARTSIG:1-2 Tablet(s) By  Mouth 1-3 Times Daily PRN   buPROPion 150 MG 24 hr tablet Commonly known as: WELLBUTRIN XL SMARTSIG:1.0 Tablet(s) By Mouth Daily   buPROPion 300 MG 24 hr tablet Commonly known as: WELLBUTRIN XL Take 1 tablet by mouth daily.   cholecalciferol 1000 units tablet Commonly known as: VITAMIN D Take 3,000 Units by mouth daily.   diazepam 5 MG tablet Commonly known as: VALIUM TAKE 1 TABLET BY MOUTH 90 mins prior TO procedure. Bring BOTTLE WITH you   doxycycline 100 MG capsule Commonly known as: VIBRAMYCIN Take 1 capsule (100 mg total) by mouth 2 (two) times daily.   ferrous sulfate 325 (65 FE) MG EC tablet Take 325 mg by mouth daily.   LORazepam 1 MG tablet Commonly known as: ATIVAN TAKE 1 TABLET BY MOUTH 90 MINUTES prior TO procedure, Bring BOTTLE WITH you   neomycin-polymyxin b-dexamethasone 3.5-10000-0.1 Oint Commonly known as:  MAXITROL SMARTSIG:sparingly In Eye(s)   Opzelura 1.5 % Crea Generic drug: Ruxolitinib Phosphate Apply topically.   Rinvoq 15 MG Tb24 Generic drug: Upadacitinib ER Take 15 mg by mouth daily in the afternoon.   sertraline 100 MG tablet Commonly known as: ZOLOFT Take 1 tablet (100 mg total) by mouth every morning.   Synthroid 137 MCG tablet Generic drug: levothyroxine Take 1 tablet (137 mcg total) by mouth daily before breakfast.   vitamin C 1000 MG tablet Take 1,000 mg by mouth daily.          OBJECTIVE:   PHYSICAL EXAM: VS: BP 112/70 (BP Location: Right Arm, Patient Position: Sitting, Cuff Size: Small)   Pulse 67   Ht 5\' 4"  (1.626 m)   Wt 149 lb (67.6 kg)   LMP 12/03/2011   SpO2 99%   BMI 25.58 kg/m    EXAM: General: Pt appears well and is in NAD  Neck: General: Supple without adenopathy. Thyroid: Thyroid size normal.  No goiter or nodules appreciated. No thyroid bruit.  Lungs: Clear with good BS bilat   Heart: Auscultation: RRR.  Abdomen: Soft, nontender  Extremities:  BL LE: No pretibial edema norma, vitiligo normal .  Mental Status: Judgment, insight: Intact Orientation: Oriented to time, place, and person Mood and affect: No depression, anxiety, or agitation     DATA REVIEWED:   09/02/2023  TSH 0.275 uIU/mL   ASSESSMENT / PLAN / RECOMMENDATIONS:   Hashimoto's Thyroiditis:   -TSH r continues to fluctuate, and has increased from 4.15 uIU/mL to 0.03 uIU/mL  -Will decrease Synthroid as below - Pt educated extensively on the correct way to take levothyroxine (first thing in the morning with water, 30 minutes before eating or taking other medications). - Pt encouraged to double dose the following day if she were to miss a dose given long half-life of levothyroxine.    Medications   Stop Synthroid 150 MCG  Start Synthroid 137 mcg daily   2.  Vitamin B12 deficiency -Patient advised to start a multivitamin  Follow-up in 6 months Repeat TSH  in 2 months  Signed electronically by: Lyndle Herrlich, MD  [facility]  [facility] [address] [place] [place], [place] [address] Phone: [phone] FAX: [phone]      CC: Lewis Moccasin, MD [address] [place] [address] Phone: [phone]  Fax: [phone]   Return to Endocrinology clinic as below: No future appointments.

## 2023-11-07 ENCOUNTER — Ambulatory Visit (INDEPENDENT_AMBULATORY_CARE_PROVIDER_SITE_OTHER): Payer: Commercial Managed Care - PPO | Admitting: Internal Medicine

## 2023-11-07 ENCOUNTER — Encounter: Payer: Self-pay | Admitting: Internal Medicine

## 2023-11-07 VITALS — BP 112/70 | HR 67 | Ht 64.0 in | Wt 149.0 lb

## 2023-11-07 DIAGNOSIS — E063 Autoimmune thyroiditis: Secondary | ICD-10-CM | POA: Diagnosis not present

## 2023-11-07 MED ORDER — SYNTHROID 125 MCG PO TABS: 125.0000 ug | ORAL_TABLET | Freq: Every day | ORAL | 1 refills | Status: DC

## 2023-11-07 NOTE — Patient Instructions (Signed)
Stop Synthroid 150 mcg Start Synthroid 125 mcg daily  Repeat labs in 2 months

## 2024-01-08 ENCOUNTER — Other Ambulatory Visit: Payer: Commercial Managed Care - PPO

## 2024-01-08 LAB — TSH: TSH: 0.39 m[IU]/L — ABNORMAL LOW (ref 0.40–4.50)

## 2024-01-08 LAB — T4: T4, Total: 5.9 ug/dL (ref 5.1–11.9)

## 2024-01-09 ENCOUNTER — Encounter: Payer: Self-pay | Admitting: Internal Medicine

## 2024-03-09 ENCOUNTER — Ambulatory Visit: Payer: Commercial Managed Care - PPO | Admitting: Internal Medicine

## 2024-03-30 ENCOUNTER — Other Ambulatory Visit: Payer: Self-pay | Admitting: Internal Medicine

## 2024-04-14 ENCOUNTER — Telehealth: Payer: Self-pay | Admitting: *Deleted

## 2024-04-14 DIAGNOSIS — Z1231 Encounter for screening mammogram for malignant neoplasm of breast: Secondary | ICD-10-CM

## 2024-04-14 NOTE — Telephone Encounter (Signed)
 Patient left voicemail on Triage line requesting return call in regards to MMG.   Returned call to patient. Patient requesting to schedule screening 3D MMG at [facility]. Denies breast pain, lump or nipple discharge. RN advised would send order to Dr. Colvin Dec. Patient aware she is overdue for annual exam and will need to schedule. Message to appointment desk to coordinate scheduling annual for patient.   Routing to provider for review and sign pended order for screening MMG.

## 2024-04-14 NOTE — Telephone Encounter (Signed)
 Order for screening mammogram signed by me.

## 2024-04-15 NOTE — Telephone Encounter (Signed)
 Pt notified via mychart. Encounter closed.

## 2024-05-05 ENCOUNTER — Ambulatory Visit: Admitting: Internal Medicine

## 2024-05-07 ENCOUNTER — Ambulatory Visit (INDEPENDENT_AMBULATORY_CARE_PROVIDER_SITE_OTHER): Admitting: Internal Medicine

## 2024-05-07 ENCOUNTER — Encounter: Payer: Self-pay | Admitting: Internal Medicine

## 2024-05-07 VITALS — BP 122/60 | HR 91 | Resp 20 | Ht 64.0 in | Wt 144.8 lb

## 2024-05-07 DIAGNOSIS — E063 Autoimmune thyroiditis: Secondary | ICD-10-CM

## 2024-05-07 NOTE — Progress Notes (Signed)
 Name: Lori Carney  MRN/ DOB: [id number], 25-May-1961    Age/ Sex: 63 y.o., female     PCP: Aleta Anda, MD   Reason for Endocrinology Evaluation: Hashimoto's Thyroiditis     Initial Endocrinology Clinic Visit: 01/03/2021    PATIENT IDENTIFIER: Lori Carney is a 63 y.o., female with a past medical history of Hashimoto's Thyroiditis and Vitiligo. She has followed with [facility] since 01/03/2021 for consultative assistance with management of her Hashimoto's Thyroiditis .      HISTORICAL SUMMARY:   She has been diagnosed with hypothyroidism in 1989 ,right after giving birth and was started on LT-4 replacement at the time.    She was on synthroid  until recently when it was switched to generic with alternating 137 and 150 mcg since September, 2021   Of note, the pt has a history of Vitiligo since 1970  On her initial visit to our clinic she had a TSH of 0.14 uIU/Ml and we reduced Synthroid  dose    Sisters with celiac disease. No FH of thyroid  disease    SUBJECTIVE:    Today (05/07/2024):  Lori Carney is here for Hashimoto's Thyroiditis .  Patient has been noted with weight loss- intentional  Takes synthroid  at night  Denies local neck swelling  Has chronic constipation  Sometimes she has a buzzing feeling in her body  No Biotin    Synthroid  125 mcg ,tablet daily     HISTORY:  Past Medical History:  Past Medical History:  Diagnosis Date   Anemia    Arthritis    COVID 04/2021   Depression    rx at same time as thyroid   on meds for over 20 years had some relpase when tried to go off feels normal on medication   Fibroid    Hx of abnormal cervical Pap smear    rx cryo and leep in past   Osteopenia    STD (sexually transmitted disease)    HPV   Thyroid  disease    hypothyroidism hashimotos    Vitamin D  deficiency    Past Surgical History:  Past Surgical History:  Procedure Laterality Date   AUGMENTATION MAMMAPLASTY      bilateralrectro pectoral saline 2006   BREAST SURGERY     cysts removed from Lt and Rt. breasts-benign   Social History:  reports that she has never smoked. She has never used smokeless tobacco. She reports current alcohol use of about 7.0 standard drinks of alcohol per week. She reports that she does not use drugs. Family History:  Family History  Problem Relation Age of Onset   Breast cancer Mother        about age 84   Arthritis Father    Cancer Father        prostate   Celiac disease Sister    Breast cancer Paternal Grandmother      HOME MEDICATIONS: Allergies as of 05/07/2024       Reactions   Macrobid [nitrofurantoin Macrocrystal] Nausea And Vomiting   Nitrofurantoin Other (See Comments)        Medication List        Accurate as of May 07, 2024 12:23 PM. If you have any questions, ask your nurse or doctor.          STOP taking these medications    ALPRAZolam 0.25 MG tablet Commonly known as: XANAX Stopped by: Saahil Herbster J Mirna Sutcliffe   diazepam 5 MG tablet Commonly known as: VALIUM Stopped by: Chrishana Spargur J  Racquelle Hyser   doxycycline  100 MG capsule Commonly known as: VIBRAMYCIN  Stopped by: Camilla Cedar Adanna Zuckerman       TAKE these medications    buPROPion  300 MG 24 hr tablet Commonly known as: WELLBUTRIN  XL Take 1 tablet by mouth daily. What changed: Another medication with the same name was removed. Continue taking this medication, and follow the directions you see here. Changed by: Gailyn Crook J Shunda Rabadi   cholecalciferol 1000 units tablet Commonly known as: VITAMIN D  Take 5,000 Units by mouth daily.   ferrous sulfate 325 (65 FE) MG EC tablet Take 325 mg by mouth daily.   LORazepam 1 MG tablet Commonly known as: ATIVAN TAKE 1 TABLET BY MOUTH 90 MINUTES prior TO procedure, Bring BOTTLE WITH you   neomycin-polymyxin b-dexamethasone 3.5-10000-0.1 Oint Commonly known as: MAXITROL SMARTSIG:sparingly In Eye(s)   Opzelura 1.5 % Crea Generic drug:  Ruxolitinib Phosphate Apply topically.   Rinvoq 15 MG Tb24 Generic drug: Upadacitinib ER Take 15 mg by mouth daily in the afternoon.   sertraline  100 MG tablet Commonly known as: ZOLOFT  Take 1 tablet (100 mg total) by mouth every morning.   Synthroid  125 MCG tablet Generic drug: levothyroxine  TAKE 1 TABLET BY MOUTH DAILY BEFORE BREAKFAST   vitamin C 1000 MG tablet Take 1,000 mg by mouth daily.          OBJECTIVE:   PHYSICAL EXAM: VS: BP 122/60   Pulse 91   Resp 20   Ht 5\' 4"  (1.626 m)   Wt 144 lb 12.8 oz (65.7 kg)   LMP 12/03/2011   SpO2 97%   BMI 24.85 kg/m    EXAM: General: Pt appears well and is in NAD  Neck: General: Supple without adenopathy. Thyroid : Thyroid  size normal.  No goiter or nodules appreciated. No thyroid  bruit.  Lungs: Clear with good BS bilat   Heart: Auscultation: RRR.  Abdomen: Soft, nontender  Extremities:  BL LE: No pretibial edema norma, vitiligo normal .  Mental Status: Judgment, insight: Intact Orientation: Oriented to time, place, and person Mood and affect: No depression, anxiety, or agitation     DATA REVIEWED:   Latest Reference Range & Units 05/07/24 12:55  TSH 0.40 - 4.50 mIU/L 1.21  T4,Free(Direct) 0.8 - 1.8 ng/dL 1.4    ASSESSMENT / PLAN / RECOMMENDATIONS:   Hashimoto's Thyroiditis:  -Patient is clinically euthyroid - TFTs are normal, no change   Medications   Continue Synthroid  125 mcg daily   Follow-up in 6 months   Signed electronically by: Natale Bail, MD  [facility]  [facility] [address] [place] [place], [place] [address] Phone: [phone] FAX: [phone]      CC: Aleta Anda, MD [address] [place] [address] Phone: [phone]  Fax: [phone]   Return to Endocrinology clinic as below: Future Appointments  Date Time Provider Department Center  05/12/2024 10:40 AM [facility] MM 3 [facility]  11/18/2024 11:00 AM  Amundson Nohemi Batters, MD [facility] None

## 2024-05-08 ENCOUNTER — Ambulatory Visit: Payer: Self-pay | Admitting: Internal Medicine

## 2024-05-08 LAB — T4, FREE: Free T4: 1.4 ng/dL (ref 0.8–1.8)

## 2024-05-08 LAB — TSH: TSH: 1.21 m[IU]/L (ref 0.40–4.50)

## 2024-05-10 ENCOUNTER — Ambulatory Visit: Payer: Self-pay | Admitting: Internal Medicine

## 2024-05-10 MED ORDER — LEVOTHYROXINE SODIUM 125 MCG PO TABS: 125.0000 ug | ORAL_TABLET | Freq: Every day | ORAL | 3 refills | Status: DC

## 2024-05-12 ENCOUNTER — Ambulatory Visit: Admitting: Internal Medicine

## 2024-05-12 ENCOUNTER — Ambulatory Visit
Admission: RE | Admit: 2024-05-12 | Discharge: 2024-05-12 | Disposition: A | Discharge status: Home / Self Care | Source: Ambulatory Visit | Attending: Obstetrics and Gynecology | Admitting: Obstetrics and Gynecology

## 2024-05-12 DIAGNOSIS — Z1231 Encounter for screening mammogram for malignant neoplasm of breast: Secondary | ICD-10-CM

## 2024-05-16 ENCOUNTER — Ambulatory Visit: Payer: Self-pay | Admitting: Obstetrics and Gynecology

## 2024-08-25 ENCOUNTER — Encounter: Payer: Self-pay | Admitting: Internal Medicine

## 2024-08-25 MED ORDER — LEVOXYL 125 MCG PO TABS: 125.0000 ug | ORAL_TABLET | Freq: Every day | ORAL | 3 refills | Status: DC

## 2024-09-06 ENCOUNTER — Ambulatory Visit: Admitting: Internal Medicine

## 2024-09-23 ENCOUNTER — Ambulatory Visit: Admitting: Internal Medicine

## 2024-09-29 ENCOUNTER — Ambulatory Visit (INDEPENDENT_AMBULATORY_CARE_PROVIDER_SITE_OTHER): Admitting: Internal Medicine

## 2024-09-29 ENCOUNTER — Other Ambulatory Visit

## 2024-09-29 ENCOUNTER — Encounter: Payer: Self-pay | Admitting: Internal Medicine

## 2024-09-29 VITALS — BP 130/80 | HR 73 | Ht 64.0 in | Wt 142.0 lb

## 2024-09-29 DIAGNOSIS — E063 Autoimmune thyroiditis: Secondary | ICD-10-CM | POA: Diagnosis not present

## 2024-09-29 NOTE — Progress Notes (Unsigned)
 Name: Lori Carney  MRN/ DOB: [id number], [birth year]    Age/ Sex: 63 y.o., female     PCP: Waylan Almarie SAUNDERS, MD   Reason for Endocrinology Evaluation: Hashimoto's Thyroiditis     Initial Endocrinology Clinic Visit: 01/03/2021    PATIENT IDENTIFIER: Lori Carney is a 63 y.o., female with a past medical history of Hashimoto's Thyroiditis and Vitiligo. She has followed with [facility] since 01/03/2021 for consultative assistance with management of her Hashimoto's Thyroiditis .      HISTORICAL SUMMARY:   She has been diagnosed with hypothyroidism in 1989 ,right after giving birth and was started on LT-4 replacement at the time.    She was on synthroid  until recently when it was switched to generic with alternating 137 and 150 mcg since September, 2021   Of note, the pt has a history of Vitiligo since 1970  On her initial visit to our clinic she had a TSH of 0.14 uIU/Ml and we reduced Synthroid  dose    Sisters with celiac disease. No FH of thyroid  disease    Insurance had to switch brand from Synthroid  to Levoxyl  In 2025 due to formulary  SUBJECTIVE:    Today (09/29/2024):  [name] is here for Hashimoto's Thyroiditis .  Weight has been stable  No local neck swelling  She takes synthroid  at night  No palpitations  Has chronic constipation , which his mild  No tremors  No Biotin    Levoxyl  125 mcg ,tablet daily     HISTORY:  Past Medical History:  Past Medical History:  Diagnosis Date   Anemia    Arthritis    COVID 04/2021   Depression    rx at same time as thyroid   on meds for over 20 years had some relpase when tried to go off feels normal on medication   Fibroid    Hx of abnormal cervical Pap smear    rx cryo and leep in past   Osteopenia    STD (sexually transmitted disease)    HPV   Thyroid  disease    hypothyroidism hashimotos    Vitamin D  deficiency    Past Surgical History:  Past Surgical History:  Procedure  Laterality Date   AUGMENTATION MAMMAPLASTY     bilateralrectro pectoral saline 2006   BREAST SURGERY     cysts removed from Lt and Rt. breasts-benign   Social History:  reports that she has never smoked. She has never used smokeless tobacco. She reports current alcohol use of about 7.0 standard drinks of alcohol per week. She reports that she does not use drugs. Family History:  Family History  Problem Relation Age of Onset   Breast cancer Mother        about age 38   Arthritis Father    Cancer Father        prostate   Celiac disease Sister    Breast cancer Paternal Grandmother      HOME MEDICATIONS: Allergies as of 09/29/2024       Reactions   Macrobid [nitrofurantoin Macrocrystal] Nausea And Vomiting   Nitrofurantoin Other (See Comments)        Medication List        Accurate as of September 29, 2024  9:29 AM. If you have any questions, ask your nurse or doctor.          buPROPion  300 MG 24 hr tablet Commonly known as: WELLBUTRIN  XL Take 1 tablet by mouth daily.   cholecalciferol 1000  units tablet Commonly known as: VITAMIN D  Take 5,000 Units by mouth daily.   Levoxyl  125 MCG tablet Generic drug: levothyroxine  Take 1 tablet (125 mcg total) by mouth daily before breakfast.   Opzelura 1.5 % Crea Generic drug: Ruxolitinib Phosphate Apply topically.   sertraline  100 MG tablet Commonly known as: ZOLOFT  Take 1 tablet (100 mg total) by mouth every morning.   VITAMIN B 12 PO Take 2,500 mcg by mouth daily.   vitamin C 1000 MG tablet Take 1,000 mg by mouth daily.          OBJECTIVE:   PHYSICAL EXAM: VS: BP 130/80 (BP Location: Left Arm, Patient Position: Sitting, Cuff Size: Normal)   Pulse 73   Ht 5' 4 (1.626 m)   Wt 142 lb (64.4 kg)   LMP 12/03/2011   SpO2 98%   BMI 24.37 kg/m    EXAM: General: Pt appears well and is in NAD  Neck: General: Supple without adenopathy. Thyroid : Thyroid  size normal.  No goiter or nodules appreciated. No  thyroid  bruit.  Lungs: Clear with good BS bilat   Heart: Auscultation: RRR.  Abdomen: Soft, nontender  Extremities:  BL LE: No pretibial edema norma, vitiligo normal .  Mental Status: Judgment, insight: Intact Orientation: Oriented to time, place, and person Mood and affect: No depression, anxiety, or agitation     DATA REVIEWED:   Latest Reference Range & Units 09/29/24 09:53  TSH 0.40 - 4.50 mIU/L 2.80  T4,Free(Direct) 0.8 - 1.8 ng/dL 1.1    ASSESSMENT / PLAN / RECOMMENDATIONS:   Hashimoto's Thyroiditis:  - Patient is clinically euthyroid - She had to change from Synthroid  to Levoxyl  due to insurance requirements and 2025 - She takes Levoxyl  at night, this historically has worked well for her - TFTs are within normal range, no change   Medications   Continue Levoxyl  125 mcg daily   Follow-up in 6 months   Signed electronically by: Stefano Redgie Butts, MD  [facility]  [facility] [address] [place] [place], [place] [address] Phone: [phone] FAX: [phone]      CC: Waylan Almarie SAUNDERS, MD [address] [place] [address] Phone: [phone]  Fax: [phone]   Return to Endocrinology clinic as below: Future Appointments  Date Time Provider Department Center  11/18/2024 11:00 AM Cathlyn JAYSON Nikki Bobie FORBES, MD [facility] None

## 2024-09-30 ENCOUNTER — Ambulatory Visit: Payer: Self-pay | Admitting: Internal Medicine

## 2024-09-30 LAB — TSH: TSH: 2.8 m[IU]/L (ref 0.40–4.50)

## 2024-09-30 LAB — T4, FREE: Free T4: 1.1 ng/dL (ref 0.8–1.8)

## 2024-09-30 MED ORDER — LEVOXYL 125 MCG PO TABS: 125.0000 ug | ORAL_TABLET | Freq: Every day | ORAL | 3 refills | Status: AC

## 2024-10-05 NOTE — Progress Notes (Unsigned)
 63 y.o. [id number] Single Caucasian female here for annual exam.    PCP: Waylan Almarie SAUNDERS, MD   Patient's last menstrual period was 12/03/2011.           Sexually active: No.  The current method of family planning is post menopausal status.    Menopausal hormone therapy:  n/a Exercising: {yes no:314532}  {types:19826} Smoker:  no  OB History  Gravida Para Term Preterm AB Living  2 2 2   2   SAB IAB Ectopic Multiple Live Births          # Outcome Date GA Lbr Len/2nd Weight Sex Type Anes PTL Lv  2 Term           1 Term              HEALTH MAINTENANCE: Last 2 paps:  05/13/19 neg, HPV neg, 04/08/18 neg, HPV neg  History of abnormal Pap or positive HPV:  no Mammogram:   09/28/21 Breast Density Cat C, BIRADS Cat 1 neg  Colonoscopy:   Bone Density:  08/17/19  Result  osteopenic    Immunization History  Administered Date(s) Administered   Moderna Sars-Covid-2 Vaccination 03/02/2020, 03/30/2020, 12/05/2020   Tdap 12/08/2013      reports that she has never smoked. She has never used smokeless tobacco. She reports current alcohol use of about 7.0 standard drinks of alcohol per week. She reports that she does not use drugs.  Past Medical History:  Diagnosis Date   Anemia    Arthritis    COVID 04/2021   Depression    rx at same time as thyroid   on meds for over 20 years had some relpase when tried to go off feels normal on medication   Fibroid    Hx of abnormal cervical Pap smear    rx cryo and leep in past   Osteopenia    STD (sexually transmitted disease)    HPV   Thyroid  disease    hypothyroidism hashimotos    Vitamin D  deficiency     Past Surgical History:  Procedure Laterality Date   AUGMENTATION MAMMAPLASTY     bilateralrectro pectoral saline 2006   BREAST SURGERY     cysts removed from Lt and Rt. breasts-benign    Current Outpatient Medications  Medication Sig Dispense Refill   Ascorbic Acid (VITAMIN C) 1000 MG tablet Take 1,000 mg by mouth daily.      buPROPion  (WELLBUTRIN  XL) 300 MG 24 hr tablet Take 1 tablet by mouth daily.  3   cholecalciferol (VITAMIN D ) 1000 units tablet Take 5,000 Units by mouth daily.     Cyanocobalamin  (VITAMIN B 12 PO) Take 2,500 mcg by mouth daily.     LEVOXYL  125 MCG tablet Take 1 tablet (125 mcg total) by mouth daily before breakfast. 90 tablet 3   OPZELURA 1.5 % CREA Apply topically.     sertraline  (ZOLOFT ) 100 MG tablet Take 1 tablet (100 mg total) by mouth every morning.     No current facility-administered medications for this visit.    ALLERGIES: Macrobid [nitrofurantoin macrocrystal] and Nitrofurantoin  Family History  Problem Relation Age of Onset   Breast cancer Mother        about age 63   Arthritis Father    Cancer Father        prostate   Celiac disease Sister    Breast cancer Paternal Grandmother     Review of Systems  PHYSICAL EXAM:  LMP 12/03/2011  General appearance: alert, cooperative and appears stated age Head: normocephalic, without obvious abnormality, atraumatic Neck: no adenopathy, supple, symmetrical, trachea midline and thyroid  normal to inspection and palpation Lungs: clear to auscultation bilaterally Breasts: normal appearance, no masses or tenderness, No nipple retraction or dimpling, No nipple discharge or bleeding, No axillary adenopathy Heart: regular rate and rhythm Abdomen: soft, non-tender; no masses, no organomegaly Extremities: extremities normal, atraumatic, no cyanosis or edema Skin: skin color, texture, turgor normal. No rashes or lesions Lymph nodes: cervical, supraclavicular, and axillary nodes normal. Neurologic: grossly normal  Pelvic: External genitalia:  no lesions              No abnormal inguinal nodes palpated.              Urethra:  normal appearing urethra with no masses, tenderness or lesions              Bartholins and Skenes: normal                 Vagina: normal appearing vagina with normal color and discharge, no lesions               Cervix: no lesions              Pap taken: {yes no:314532} Bimanual Exam:  Uterus:  normal size, contour, position, consistency, mobility, non-tender              Adnexa: no mass, fullness, tenderness              Rectal exam: {yes no:314532}.  Confirms.              Anus:  normal sphincter tone, no lesions  Chaperone was present for exam:  {BSCHAPERONE:31226::Emily F, CMA}  ASSESSMENT: Well woman visit with gynecologic exam.  PHQ-2-9: ***  ***  PLAN: Mammogram screening discussed. Self breast awareness reviewed. Pap and HRV collected:  {yes no:314532} Guidelines for Calcium, Vitamin D , regular exercise program including cardiovascular and weight bearing exercise. Medication refills:  *** {LABS (Optional):23779} Follow up:  ***    Additional counseling given.  {yes [id number]. ***  total time was spent for this patient encounter, including preparation, face-to-face counseling with the patient, coordination of care, and documentation of the encounter in addition to doing the well woman visit with gynecologic exam.

## 2024-10-06 ENCOUNTER — Ambulatory Visit (INDEPENDENT_AMBULATORY_CARE_PROVIDER_SITE_OTHER): Admitting: Obstetrics and Gynecology

## 2024-10-06 ENCOUNTER — Encounter: Payer: Self-pay | Admitting: Obstetrics and Gynecology

## 2024-10-06 ENCOUNTER — Other Ambulatory Visit (HOSPITAL_COMMUNITY)
Admission: RE | Admit: 2024-10-06 | Discharge: 2024-10-06 | Disposition: A | Discharge status: Home / Self Care | Source: Ambulatory Visit | Attending: Obstetrics and Gynecology | Admitting: Obstetrics and Gynecology

## 2024-10-06 VITALS — BP 120/82 | HR 76 | Ht 64.75 in | Wt 144.0 lb

## 2024-10-06 DIAGNOSIS — Z9189 Other specified personal risk factors, not elsewhere classified: Secondary | ICD-10-CM | POA: Diagnosis not present

## 2024-10-06 DIAGNOSIS — M810 Age-related osteoporosis without current pathological fracture: Secondary | ICD-10-CM

## 2024-10-06 DIAGNOSIS — Z1151 Encounter for screening for human papillomavirus (HPV): Secondary | ICD-10-CM | POA: Insufficient documentation

## 2024-10-06 DIAGNOSIS — Z124 Encounter for screening for malignant neoplasm of cervix: Secondary | ICD-10-CM

## 2024-10-06 DIAGNOSIS — Z87410 Personal history of cervical dysplasia: Secondary | ICD-10-CM | POA: Insufficient documentation

## 2024-10-06 DIAGNOSIS — Z01419 Encounter for gynecological examination (general) (routine) without abnormal findings: Secondary | ICD-10-CM | POA: Diagnosis present

## 2024-10-06 MED ORDER — ESTRADIOL 10 MCG VA TABS: 1.0000 | ORAL_TABLET | VAGINAL | 3 refills | Status: DC

## 2024-10-06 NOTE — Patient Instructions (Addendum)
 Contact Dr. Kristie to schedule your next colonoscopy.   EXERCISE AND DIET:  We recommended that you start or continue a regular exercise program for good health. Regular exercise means any activity that makes your heart beat faster and makes you sweat.  We recommend exercising at least 30 minutes per day at least 3 days a week, preferably 4 or 5.  We also recommend a diet low in fat and sugar.  Inactivity, poor dietary choices and obesity can cause diabetes, heart attack, stroke, and kidney damage, among others.    ALCOHOL AND SMOKING:  Women should limit their alcohol intake to no more than 7 drinks/beers/glasses of wine (combined, not each!) per week. Moderation of alcohol intake to this level decreases your risk of breast cancer and liver damage. And of course, no recreational drugs are part of a healthy lifestyle.  And absolutely no smoking or even second hand smoke. Most people know smoking can cause heart and lung diseases, but did you know it also contributes to weakening of your bones? Aging of your skin?  Yellowing of your teeth and nails?  CALCIUM AND VITAMIN D :  Adequate intake of calcium and Vitamin D  are recommended.  The recommendations for exact amounts of these supplements seem to change often, but generally speaking 600 mg of calcium (either carbonate or citrate) and 800 units of Vitamin D  per day seems prudent. Certain women may benefit from higher intake of Vitamin D .  If you are among these women, your doctor will have told you during your visit.    PAP SMEARS:  Pap smears, to check for cervical cancer or precancers,  have traditionally been done yearly, although recent scientific advances have shown that most women can have pap smears less often.  However, every woman still should have a physical exam from her gynecologist every year. It will include a breast check, inspection of the vulva and vagina to check for abnormal growths or skin changes, a visual exam of the cervix, and then an  exam to evaluate the size and shape of the uterus and ovaries.  And after 65 years of age, a rectal exam is indicated to check for rectal cancers. We will also provide age appropriate advice regarding health maintenance, like when you should have certain vaccines, screening for sexually transmitted diseases, bone density testing, colonoscopy, mammograms, etc.   MAMMOGRAMS:  All women over 12 years old should have a yearly mammogram. Many facilities now offer a 3D mammogram, which may cost around $50 extra out of pocket. If possible,  we recommend you accept the option to have the 3D mammogram performed.  It both reduces the number of women who will be called back for extra views which then turn out to be normal, and it is better than the routine mammogram at detecting truly abnormal areas.    COLONOSCOPY:  Colonoscopy to screen for colon cancer is recommended for all women at age 50.  We know, you hate the idea of the prep.  We agree, BUT, having colon cancer and not knowing it is worse!!  Colon cancer so often starts as a polyp that can be seen and removed at colonscopy, which can quite literally save your life!  And if your first colonoscopy is normal and you have no family history of colon cancer, most women don't have to have it again for 10 years.  Once every ten years, you can do something that may end up saving your life, right?  We will be happy  to help you get it scheduled when you are ready.  Be sure to check your insurance coverage so you understand how much it will cost.  It may be covered as a preventative service at no cost, but you should check your particular policy.     Weak, Fragile Bones (Osteoporosis): What to Know  Osteoporosis is when the bones become thin and less dense than normal. Osteoporosis makes bones more fragile and more likely to break. Over time, the condition can cause your bones to become so weak that they break, or fracture, after a minor fall. Bones in the hip,  wrist, and spine are most likely to break. What are the causes? The exact cause of this condition is not known. What increases the risk? Having family members with this condition. Taking: Steroid medicines. Anti-seizure medicines. Being female. Being 78 years old or older. Being of [place] decent. Smoking or using other products that contain nicotine or tobacco. Not exercising. What are the signs or symptoms? A broken bone might be the first sign, especially if the break results from a fall or injury that usually would not cause a bone to break. Other signs and symptoms include: Pain in the neck or low back. Being hunched over. Getting shorter. How is this diagnosed? This condition may be diagnosed based on: Your medical history. A physical exam. A bone mineral density test, also called a DXA or DEXA test. This test uses X-rays to measure how dense your bones are. How is this treated? This condition may be treated with medicines and supplements, including: Taking medicines to slow bone loss or help make the bones stronger. Taking calcium and vitamin D  supplements every day. Taking hormone replacement medicines, such as estrogen for female and testosterone for males. It may also be treated by: Quitting smoking or using tobacco products. Doing exercises. Limiting how much alcohol you drink. Eating more foods with calcium and vitamin D  in them. Monitoring your blood levels of calcium and vitamin D . The goal of treatment is to strengthen your bones and lower your risk for a bone break. Follow these instructions at home: Eating and drinking Eat plenty of food with calcium and vitamin D  in them. This may include: Some fish, such as salmon and tuna. Foods that have calcium and vitamin D  added to them, such as some cereals. Egg yolks. Cheese. Liver.  Activity Exercise as told. Ask what things are safe for you to do. You may be told to: Do exercises that make your muscles work  to hold your body weight up, such as tai chi, yoga, or walking. These are called weight-bearing exercises. Do exercises to make your muscles stronger, such as lifting weights. These are called muscle-strengthening exercises. Lifestyle Do not drink alcohol if your health care provider tells you not to drink. Your health care provider tells you not to drink. You are pregnant, may be pregnant, or are planning to become pregnant. If you drink alcohol: Limit how much you have to: 0-1 drink a day if you're female. 0-2 drinks a day if you're female. Know how much alcohol is in your drink. In the [place] one drink is one 12 oz bottle of beer (355 mL), one 5 oz glass of wine (148 mL), or one 1 oz glass of hard liquor (44 mL). Do not smoke, vape, or use nicotine or tobacco. Preventing falls Use a cane, walker, scooter, or crutches to help you move around if needed. Keep rooms well-lit and get rid of clutter. Put  away things on the floor that could make you trip, such as cords and rugs. Put grab bars in bathrooms and safety rails on stairs. Use rubber mats in slippery areas, like bathrooms. Wear shoes that: Fit you well. Support your feet. Have closed toes. Have rubber soles or low heels. Talk to your provider about all of the medicines you take. Some medicines can make you more likely to fall because they can cause dizziness or changes in blood pressure. General instructions Take your medicines only as told. Keep all follow-up visits. Your provider may want to repeat tests. Where to find more information Bone Health and Osteoporosis Foundation: [email or web address] Contact a health care provider if: You have never been screened for osteoporosis and you are: A female who is 68 years old or older. A female who is age 44 years old or older. Get help right away if: You fall. You get hurt. This information is not intended to replace advice given to you by your health care provider. Make sure  you discuss any questions you have with your health care provider. Document Revised: 09/30/2023 Document Reviewed: 06/06/2023 Elsevier Patient Education  2024 [employer] Inc[employer]  Alendronate Solution What is this medication? ALENDRONATE (a LEN droe nate) treats osteoporosis. It works by interior and spatial designer stronger and less likely to break (fracture). It belongs to a group of medications called bisphosphonates. This medicine may be used for other purposes; ask your health care provider or pharmacist if you have questions. COMMON BRAND NAME(S): Fosamax What should I tell my care team before I take this medication? They need to know if you have any of these conditions: Bleeding disorder Cancer Dental disease Difficulty swallowing Infection (fever, chills, cough, sore throat, pain or trouble passing urine) Kidney disease Low levels of calcium or other minerals in the blood Low red blood cell counts Receiving steroids like dexamethasone or prednisone Stomach or intestine problems Trouble sitting or standing for 30 minutes An unusual or allergic reaction to alendronate, other medications, foods, dyes or preservatives Pregnant or trying to get pregnant Breast-feeding How should I use this medication? Take this medication by mouth with a full glass of water. Take it as directed on the prescription label at the same time every day. Use a specially marked oral syringe, spoon, or dropper to measure each dose. Ask your pharmacist if you do not have one. Household spoons are not accurate. Take the dose right after waking up. Do not eat or drink anything before taking it. Do not take it with any other drink except water. After taking it, do not eat breakfast, drink, or take any other medications or vitamins for at least 30 minutes. Sit or stand up for at least 30 minutes after you take it. Do not lie down. Keep taking it unless your care team tells you to stop. A special MedGuide will be given to you by the  pharmacist with each prescription and refill. Be sure to read this information carefully each time. Talk to your care team about the use of this medication in children. Special care may be needed. Overdosage: If you think you have taken too much of this medicine contact a poison control center or emergency room at once. NOTE: This medicine is only for you. Do not share this medicine with others. What if I miss a dose? If you take your medication once a day, skip it. Take your next dose at the scheduled time the next morning. Do not take two doses on the same  day. If you take your medication once a week, take the missed dose on the morning after you remember. Do not take two doses on the same day. What may interact with this medication? Aluminum hydroxide Antacids Aspirin Calcium supplements Iron supplements Magnesium supplements Medications for inflammation like ibuprofen, naproxen, and others Vitamins with minerals This list may not describe all possible interactions. Give your health care provider a list of all the medicines, herbs, non-prescription drugs, or dietary supplements you use. Also tell them if you smoke, drink alcohol, or use illegal drugs. Some items may interact with your medicine. What should I watch for while using this medication? Visit your care team for regular checks on your progress. It may be some time before you see the benefit from this medication. Some people who take this medication have severe bone, joint, or muscle pain. This medication may also increase your risk for jaw problems or a broken thigh bone. Tell your care team right away if you have severe pain in your jaw, bones, joints, or muscles. Tell you care team if you have any pain that does not go away or that gets worse. Tell your dentist and dental surgeon that you are taking this medication. You should not have major dental surgery while on this medication. See your dentist to have a dental exam and fix any  dental problems before starting this medication. Take good care of your teeth while on this medication. Make sure you see your dentist for regular follow-up appointments. You should make sure you get enough calcium and vitamin D  while you are taking this medication. Discuss the foods you eat and the vitamins you take with your care team. You may need blood work done while you are taking this medication. What side effects may I notice from receiving this medication? Side effects that you should report to your care team as soon as possible: Allergic reactions--skin rash, itching, hives, swelling of the face, lips, tongue, or throat Low calcium level--muscle pain or cramps, confusion, tingling, or numbness in the hands or feet Osteonecrosis of the jaw--pain, swelling, or redness in the mouth, numbness of the jaw, poor healing after dental work, unusual discharge from the mouth, visible bones in the mouth Pain or trouble swallowing Severe bone, joint, or muscle pain Stomach bleeding--bloody or black, tar-like stools, vomiting blood or brown material that looks like coffee grounds Side effects that usually do not require medical attention (report to your care team if they continue or are bothersome): Constipation Diarrhea Nausea Stomach pain This list may not describe all possible side effects. Call your doctor for medical advice about side effects. You may report side effects to FDA at 1-800-FDA-1088. Where should I keep my medication? Keep out of the reach of children and pets. Store at room temperature between 20 and 25 degrees C (68 and 77 degrees F). Do not freeze. Throw away any unused medication after the expiration date. NOTE: This sheet is a summary. It may not cover all possible information. If you have questions about this medicine, talk to your doctor, pharmacist, or health care provider.  2024 Elsevier/Gold Standard (2020-11-16 00:00:00)  Denosumab Injection (Osteoporosis) What is  this medication? DENOSUMAB (den oh SUE mab) prevents and treats osteoporosis. It works by interior and spatial designer stronger and less likely to break (fracture). It is a monoclonal antibody. This medicine may be used for other purposes; ask your health care provider or pharmacist if you have questions. COMMON BRAND NAME(S): Prolia What should I tell  my care team before I take this medication? They need to know if you have any of these conditions: Dental or gum disease Had thyroid  or parathyroid (glands located in neck) surgery Having dental surgery or a tooth pulled Kidney disease Low levels of calcium in the blood On dialysis Poor nutrition Thyroid  disease Trouble absorbing nutrients from your food An unusual or allergic reaction to denosumab, other medications, foods, dyes, or preservatives Pregnant or trying to get pregnant Breastfeeding How should I use this medication? This medication is injected under the skin. It is given by your care team in a hospital or clinic setting. A special MedGuide will be given to you before each treatment. Be sure to read this information carefully each time. Talk to your care team about the use of this medication in children. Special care may be needed. Overdosage: If you think you have taken too much of this medicine contact a poison control center or emergency room at once. NOTE: This medicine is only for you. Do not share this medicine with others. What if I miss a dose? Keep appointments for follow-up doses. It is important not to miss your dose. Call your care team if you are unable to keep an appointment. What may interact with this medication? Do not take this medication with any of the following: Other medications that contain denosumab This medication may also interact with the following: Medications that lower your chance of fighting infection Steroid medications, such as prednisone or cortisone This list may not describe all possible  interactions. Give your health care provider a list of all the medicines, herbs, non-prescription drugs, or dietary supplements you use. Also tell them if you smoke, drink alcohol, or use illegal drugs. Some items may interact with your medicine. What should I watch for while using this medication? Your condition will be monitored carefully while you are receiving this medication. You may need blood work done while taking this medication. This medication may increase your risk of getting an infection. Call your care team for advice if you get a fever, chills, sore throat, or other symptoms of a cold or flu. Do not treat yourself. Try to avoid being around people who are sick. Tell your dentist and dental surgeon that you are taking this medication. You should not have major dental surgery while on this medication. See your dentist to have a dental exam and fix any dental problems before starting this medication. Take good care of your teeth while on this medication. Make sure you see your dentist for regular follow-up appointments. This medication may cause low levels of calcium in your body. The risk of severe side effects is increased in people with kidney disease. Your care team may prescribe calcium and vitamin D  to help prevent low calcium levels while you take this medication. It is important to take calcium and vitamin D  as directed by your care team. Talk to your care team if you may be pregnant. Serious birth defects may occur if you take this medication during pregnancy and for 5 months after the last dose. You will need a negative pregnancy test before starting this medication. Contraception is recommended while taking this medication and for 5 months after the last dose. Your care team can help you find the option that works for you. Talk to your care team before breastfeeding. Changes to your treatment plan may be needed. What side effects may I notice from receiving this medication? Side  effects that you should report to your  care team as soon as possible: Allergic reactions--skin rash, itching, hives, swelling of the face, lips, tongue, or throat Infection--fever, chills, cough, sore throat, wounds that don't heal, pain or trouble when passing urine, general feeling of discomfort or being unwell Low calcium level--muscle pain or cramps, confusion, tingling, or numbness in the hands or feet Osteonecrosis of the jaw--pain, swelling, or redness in the mouth, numbness of the jaw, poor healing after dental work, unusual discharge from the mouth, visible bones in the mouth Severe bone, joint, or muscle pain Skin infection--skin redness, swelling, warmth, or pain Side effects that usually do not require medical attention (report these to your care team if they continue or are bothersome): Back pain Headache Joint pain Muscle pain Pain in the hands, arms, legs, or feet Runny or stuffy nose Sore throat This list may not describe all possible side effects. Call your doctor for medical advice about side effects. You may report side effects to FDA at 1-800-FDA-1088. Where should I keep my medication? This medication is given in a hospital or clinic. It will not be stored at home. NOTE: This sheet is a summary. It may not cover all possible information. If you have questions about this medicine, talk to your doctor, pharmacist, or health care provider.  2024 Elsevier/Gold Standard (2022-12-24 00:00:00)  Zoledronic Acid Injection (Bone Disorders) What is this medication? ZOLEDRONIC ACID (ZOE le dron ik AS id) prevents and treats osteoporosis. It may also be used to treat Paget's disease of the bone. It works by interior and spatial designer stronger and less likely to break (fracture). It belongs to a group of medications called bisphosphonates. This medicine may be used for other purposes; ask your health care provider or pharmacist if you have questions. COMMON BRAND NAME(S): Reclast What should  I tell my care team before I take this medication? They need to know if you have any of these conditions: Bleeding disorder Cancer Dental disease Kidney disease Low levels of calcium in the blood Low red blood cell counts Lung or breathing disease, such as asthma Receiving steroids, such as dexamethasone or prednisone An unusual or allergic reaction to zoledronic acid, other medications, foods, dyes, or preservatives Pregnant or trying to get pregnant Breast-feeding How should I use this medication? This medication is injected into a vein. It is given by your care team in a hospital or clinic setting. A special MedGuide will be given to you before each treatment. Be sure to read this information carefully each time. Talk to your care team about the use of this medication in children. Special care may be needed. Overdosage: If you think you have taken too much of this medicine contact a poison control center or emergency room at once. NOTE: This medicine is only for you. Do not share this medicine with others. What if I miss a dose? Keep appointments for follow-up doses. It is important not to miss your dose. Call your care team if you are unable to keep an appointment. What may interact with this medication? Certain antibiotics given by injection Medications for pain and inflammation, such as ibuprofen, naproxen, NSAIDs Some diuretics, such as bumetanide, furosemide Teriparatide This list may not describe all possible interactions. Give your health care provider a list of all the medicines, herbs, non-prescription drugs, or dietary supplements you use. Also tell them if you smoke, drink alcohol, or use illegal drugs. Some items may interact with your medicine. What should I watch for while using this medication? Visit your care team for  regular checks on your progress. It may be some time before you see the benefit from this medication. Some people who take this medication have severe  bone, joint, or muscle pain. This medication may also increase your risk for jaw problems or a broken thigh bone. Tell your care team right away if you have severe pain in your jaw, bones, joints, or muscles. Tell your care team if you have any pain that does not go away or that gets worse. You should make sure you get enough calcium and vitamin D  while you are taking this medication. Discuss the foods you eat and the vitamins you take with your care team. You may need bloodwork while taking this medication. Tell your dentist and dental surgeon that you are taking this medication. You should not have major dental surgery while on this medication. See your dentist to have a dental exam and fix any dental problems before starting this medication. Take good care of your teeth while on this medication. Make sure you see your dentist for regular follow-up appointments. What side effects may I notice from receiving this medication? Side effects that you should report to your care team as soon as possible: Allergic reactions--skin rash, itching, hives, swelling of the face, lips, tongue, or throat Kidney injury--decrease in the amount of urine, swelling of the ankles, hands, or feet Low calcium level--muscle pain or cramps, confusion, tingling, or numbness in the hands or feet Osteonecrosis of the jaw--pain, swelling, or redness in the mouth, numbness of the jaw, poor healing after dental work, unusual discharge from the mouth, visible bones in the mouth Severe bone, joint, or muscle pain Side effects that usually do not require medical attention (report to your care team if they continue or are bothersome): Diarrhea Dizziness Headache Nausea Stomach pain Vomiting This list may not describe all possible side effects. Call your doctor for medical advice about side effects. You may report side effects to FDA at 1-800-FDA-1088. Where should I keep my medication? This medication is given in a hospital or  clinic. It will not be stored at home. NOTE: This sheet is a summary. It may not cover all possible information. If you have questions about this medicine, talk to your doctor, pharmacist, or health care provider.  2024 Elsevier/Gold Standard (2022-01-04 00:00:00)  Romosozumab Injection What is this medication? ROMOSOZUMAB (roe moe SOZ ue mab) prevents and treats osteoporosis. It works by interior and spatial designer stronger and less likely to break (fracture). It is a monoclonal antibody. This medicine may be used for other purposes; ask your health care provider or pharmacist if you have questions. COMMON BRAND NAME(S): EVENITY What should I tell my care team before I take this medication? They need to know if you have any of these conditions: Dental disease Heart attack Heart disease Kidney problems Low levels of calcium in the blood On dialysis Stroke Wear dentures An unusual or allergic reaction to romosozumab, other medications, foods, dyes or preservatives Pregnant or trying to get pregnant Breast-feeding How should I use this medication? This medication is injected under the skin. It is given by your care team in a hospital or clinic setting. A special MedGuide will be given to you by the pharmacist with each prescription and refill. Be sure to read this information carefully each time. Talk to your care team about the use of this medication in children. Special care may be needed. Overdosage: If you think you have taken too much of this medicine contact a poison  control center or emergency room at once. NOTE: This medicine is only for you. Do not share this medicine with others. What if I miss a dose? Keep appointments for follow-up doses. It is important not to miss your dose. Call your care team if you are unable to keep an appointment. What may interact with this medication? Interactions are not expected. This list may not describe all possible interactions. Give your health care  provider a list of all the medicines, herbs, non-prescription drugs, or dietary supplements you use. Also tell them if you smoke, drink alcohol, or use illegal drugs. Some items may interact with your medicine. What should I watch for while using this medication? Your condition will be monitored carefully while you are receiving this medication. You may need bloodwork while taking this medication. You should make sure you get enough calcium and vitamin D  while you are taking this medication. Discuss the foods you eat and the vitamins you take with your care team. Some people who take this medication have severe bone, joint, or muscle pain. This medication may also increase your risk for jaw problems or a broken thigh bone. Tell your care team right away if you have severe pain in your jaw, bones, joints, or muscles. Tell you care team if you have any pain that does not go away or that gets worse. Tell your dentist and dental surgeon that you are taking this medication. You should not have major dental surgery while on this medication. See your dentist to have a dental exam and fix any dental problems before starting this medication. Take good care of your teeth while on this medication. Make sure you see your dentist for regular follow-up appointments. What side effects may I notice from receiving this medication? Side effects that you should report to your care team as soon as possible: Allergic reactions or angioedema--skin rash, itching or hives, swelling of the face, eyes, lips, tongue, arms, or legs, trouble swallowing or breathing Heart attack--pain or tightness in the chest, shoulders, arms, or jaw, nausea, shortness of breath, cold or clammy skin, feeling faint or lightheaded Low calcium level--muscle pain or cramps, confusion, tingling, or numbness in the hands or feet Osteonecrosis of the jaw--pain, swelling, or redness in the mouth, numbness of the jaw, poor healing after dental work, unusual  discharge from the mouth, visible bones in the mouth Severe bone, joint, or muscle pain Stroke--sudden numbness or weakness of the face, arm, or leg, trouble speaking, confusion, trouble walking, loss of balance or coordination, dizziness, severe headache, change in vision Side effects that usually do not require medical attention (report to your care team if they continue or are bothersome): Headache Joint pain Muscle spasms Pain, redness, or irritation at injection site Swelling of the ankles, hands, or feet This list may not describe all possible side effects. Call your doctor for medical advice about side effects. You may report side effects to FDA at 1-800-FDA-1088. Where should I keep my medication? This medication is given in a hospital or clinic. It will not be stored at home. NOTE: This sheet is a summary. It may not cover all possible information. If you have questions about this medicine, talk to your doctor, pharmacist, or health care provider.  2024 Elsevier/Gold Standard (2021-12-19 00:00:00)

## 2024-10-07 LAB — PTH, INTACT AND CALCIUM
Calcium: 9.6 mg/dL (ref 8.6–10.4)
PTH: 41 pg/mL (ref 16–77)

## 2024-10-07 LAB — PHOSPHORUS: Phosphorus: 4.1 mg/dL (ref 2.1–4.3)

## 2024-10-08 ENCOUNTER — Encounter: Payer: Self-pay | Admitting: Obstetrics and Gynecology

## 2024-10-08 ENCOUNTER — Other Ambulatory Visit: Payer: Self-pay | Admitting: Obstetrics and Gynecology

## 2024-10-08 MED ORDER — ESTRADIOL 0.01 % VA CREA
TOPICAL_CREAM | VAGINAL | 2 refills | Status: AC
Start: 2024-10-08 — End: ?

## 2024-10-11 ENCOUNTER — Ambulatory Visit: Payer: Self-pay | Admitting: Obstetrics and Gynecology

## 2024-10-11 LAB — CYTOLOGY - PAP
Comment: NEGATIVE
Diagnosis: NEGATIVE
High risk HPV: NEGATIVE

## 2024-11-10 ENCOUNTER — Ambulatory Visit: Admitting: Obstetrics and Gynecology

## 2024-11-10 ENCOUNTER — Encounter: Payer: Self-pay | Admitting: Obstetrics and Gynecology

## 2024-11-10 VITALS — BP 104/60

## 2024-11-10 DIAGNOSIS — M810 Age-related osteoporosis without current pathological fracture: Secondary | ICD-10-CM | POA: Diagnosis not present

## 2024-11-10 MED ORDER — ALENDRONATE SODIUM 70 MG PO TABS: 70.0000 mg | ORAL_TABLET | ORAL | 0 refills | Status: AC

## 2024-11-10 NOTE — Progress Notes (Unsigned)
 GYNECOLOGY  VISIT   HPI: 63 y.o.   Single  Caucasian female   G2P2002 with Patient's last menstrual period was 12/03/2011.   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  T score of spine:  -2.5.  Labs show normal PTH, calcium, phosphorus, vit D, creatinine (e GFR 59), thyroid  function.  See Epic 09/29/24 and 10/06/24.  May have replacement of a dental implant in the future.  No planned procedure at this time.   No hx reflux.   Deals with back pain.   GYNECOLOGIC HISTORY: Patient's last menstrual period was 12/03/2011. Contraception:   Menopausal hormone therapy:  Estrace   Last 2 paps:  10/06/24 neg HR HPV neg, 05/13/19 neg HPV neg  History of abnormal Pap or positive HPV:   Yes,  Pap 04/03/16 - ASCUS and positive HR HPV. 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 Pap in 2016 was negative and negative HR HPV.  Pap in 2015 was normal and positive HR HPV and negative subtypes 16 and 18.  Hx LEEP in 2006 or 2007. Hx of cryotherapy.  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   Allergic rhinitis 05/09/2014   Joint pain 05/09/2014   Preventive measure 05/09/2014   Hashimoto's disease 04/06/2013   Vitiligo 04/06/2013   FHx: celiac disease 04/06/2013    Past Medical History:  Diagnosis Date   Anemia    Arthritis    COVID 04/2021   Depression    rx at same time as thyroid   on meds for over 20 years had some  relpase when tried to go off feels normal on medication   Fibroid    Hx of abnormal cervical Pap smear    rx cryo and leep in past   Osteopenia    STD (sexually transmitted disease)    HPV   Thyroid  disease    hypothyroidism hashimotos    Vitamin D  deficiency     Past Surgical History:  Procedure Laterality Date   AUGMENTATION MAMMAPLASTY     bilateralrectro pectoral saline 2006   BREAST SURGERY     cysts removed from Lt and Rt. breasts-benign    Current Outpatient Medications  Medication Sig Dispense Refill   alendronate  (FOSAMAX ) 70 MG tablet Take 1 tablet (70 mg total) by mouth every 7 (seven) days. Take with a full glass of water on an empty stomach. 12 tablet 0   Ascorbic Acid (VITAMIN C) 1000 MG tablet Take 1,000 mg by mouth daily.     buPROPion  (WELLBUTRIN  XL) 300 MG 24 hr tablet Take 1 tablet by mouth daily.  3   Calcium  Carb-Cholecalciferol (OYSTER SHELL CALCIUM 250+D) 250-3.125 MG-MCG TABS Take 1 tablet by mouth.     cholecalciferol (VITAMIN D ) 1000 units tablet Take 5,000 Units by mouth daily.     Cyanocobalamin  (VITAMIN B 12 PO) Take 2,500 mcg by mouth daily.     estradiol  (ESTRACE ) 0.01 % CREA vaginal cream Place 1/2 gram per vagina at bedtime for the first 2 weeks.  Then place 1/2 gram per vagina at bedtime 2 - 3 times per week. 42.5 g 2   LEVOXYL  125 MCG tablet Take 1 tablet (125 mcg total) by mouth daily before breakfast. 90 tablet 3   OPZELURA 1.5 % CREA Apply topically.     sertraline  (ZOLOFT ) 100 MG tablet Take 1 tablet (100 mg total) by mouth every morning.     No current facility-administered medications for this visit.     ALLERGIES: Macrobid [nitrofurantoin macrocrystal] and Nitrofurantoin  Family History  Problem Relation Age of Onset   Breast cancer Mother        about age 57   Arthritis Father    Cancer Father        prostate   Celiac disease Sister    Breast cancer Paternal Grandmother     Social History   Socioeconomic History   Marital  status: Single    Spouse name: Not on file   Number of children: Not on file   Years of education: Not on file   Highest education level: Not on file  Occupational History   Not on file  Tobacco Use   Smoking status: Never   Smokeless tobacco: Never  Vaping Use   Vaping status: Never Used  Substance and Sexual Activity   Alcohol use: Yes    Alcohol/week: 7.0 standard drinks of alcohol    Types: 7 Standard drinks or equivalent per week   Drug use: No   Sexual activity: Not Currently    Partners: Male    Birth control/protection: Post-menopausal    Comment: More than 5, after 14, no std, no abnormal pap, no cancer, no DES  Other Topics Concern   Not on file  Social History Narrative   Regular exercise: seldom   Caffeine use: tea daily   Single porig from [place]  [place] in [place] lived [place]   In [place] for about 2-3 years.    6 hours of sleep per nights   Lives alone/no pets   Single    G2P2 children grown and healthy   BA degrees cabin crew  [employer]         Social Drivers of Health   Financial Resource Strain: Not on file  Food Insecurity: Low Risk (03/18/2024)   Received from [facility]   Hunger Vital Sign    Within the past 12 months, you worried that your food would run out before you got money to buy more: Never true    Within the past 12 months, the food you bought just didn't last and you didn't have money to get more. : Never true  Transportation Needs: No Transportation Needs (03/18/2024)   Received from [facility]    In the past 12 months, has lack of reliable transportation kept you from medical appointments, meetings, work or from getting things needed for daily living? : No  Physical Activity: Not on file  Stress: Not on file  Social Connections: Not on file  Intimate Partner Violence: Not on file    Review of Systems  All  other systems reviewed and are negative.   PHYSICAL EXAMINATION:   BP 104/60 (BP  Location: Left Arm, Patient Position: Sitting)   LMP 12/03/2011     General appearance: alert, cooperative and appears stated age  ASSESSMENT:  Osteoporosis of spine. Hx left arm fractures.  Traumatic.   Hypothyroidism.   PLAN:  We discussed osteopenia and osteoporosis - risk factors, treatment options, and follow up.  Tx may include Fosamax , Evista, Reclast, Prolia, Evenity.  Risks and benefits reviewed.  Rx for Fosamax  70 mg weekly.  Instructed to take with large glass of water, remain upright and do not eat or drink anything for 30 min after taking the Fosamax . Calcium 1200 mg daily in divided dosages.  Vit D 800 IU daily.  Weight bearing exercise discussed.  Fall risk reduction reviewed.  FU BMD in December, 2026.  Fu in 3 months for a recheck  35 min  total time was spent for this patient encounter, including preparation, face-to-face counseling with the patient, coordination of care, and documentation of the encounter.

## 2024-11-10 NOTE — Patient Instructions (Signed)
Alendronate Tablets What is this medication? ALENDRONATE (a LEN droe nate) prevents and treats osteoporosis. It may also be used to treat Paget disease of the bone. It works by Interior and spatial designer stronger and less likely to break (fracture). It belongs to a group of medications called bisphosphonates. This medicine may be used for other purposes; ask your health care provider or pharmacist if you have questions. COMMON BRAND NAME(S): Fosamax What should I tell my care team before I take this medication? They need to know if you have any of these conditions: Bleeding disorder Cancer Dental disease Difficulty swallowing Infection (fever, chills, cough, sore throat, pain or trouble passing urine) Kidney disease Low levels of calcium or other minerals in the blood Low red blood cell counts Receiving steroids like dexamethasone or prednisone Stomach or intestine problems Trouble sitting or standing for 30 minutes An unusual or allergic reaction to alendronate, other medications, foods, dyes or preservatives Pregnant or trying to get pregnant Breast-feeding How should I use this medication? Take this medication by mouth with a full glass of water. Take it as directed on the prescription label at the same time every day. Take the dose right after waking up. Do not eat or drink anything before taking it. Do not take it with any other drink except water. Do not chew or crush the tablet. After taking it, do not eat breakfast, drink, or take any other medications or vitamins for at least 30 minutes. Sit or stand up for at least 30 minutes after you take it. Do not lie down. Keep taking it unless your care team tells you to stop. A special MedGuide will be given to you by the pharmacist with each prescription and refill. Be sure to read this information carefully each time. Talk to your care team about the use of this medication in children. Special care may be needed. Overdosage: If you think you have  taken too much of this medicine contact a poison control center or emergency room at once. NOTE: This medicine is only for you. Do not share this medicine with others. What if I miss a dose? If you take your medication once a day, skip it. Take your next dose at the scheduled time the next morning. Do not take two doses on the same day. If you take your medication once a week, take the missed dose on the morning after you remember. Do not take two doses on the same day. What may interact with this medication? Aluminum hydroxide Antacids Aspirin Calcium supplements Medications for inflammation like ibuprofen, naproxen, and others Iron supplements Magnesium supplements Vitamins with minerals This list may not describe all possible interactions. Give your health care provider a list of all the medicines, herbs, non-prescription drugs, or dietary supplements you use. Also tell them if you smoke, drink alcohol, or use illegal drugs. Some items may interact with your medicine. What should I watch for while using this medication? Visit your care team for regular checks on your progress. It may be some time before you see the benefit from this medication. Some people who take this medication have severe bone, joint, or muscle pain. This medication may also increase your risk for jaw problems or a broken thigh bone. Tell your care team right away if you have severe pain in your jaw, bones, joints, or muscles. Tell you care team if you have any pain that does not go away or that gets worse. Tell your dentist and dental surgeon that you are  taking this medication. You should not have major dental surgery while on this medication. See your dentist to have a dental exam and fix any dental problems before starting this medication. Take good care of your teeth while on this medication. Make sure you see your dentist for regular follow-up appointments. You should make sure you get enough calcium and vitamin D  while you are taking this medication. Discuss the foods you eat and the vitamins you take with your care team. You may need blood work done while you are taking this medication. What side effects may I notice from receiving this medication? Side effects that you should report to your care team as soon as possible: Allergic reactions--skin rash, itching, hives, swelling of the face, lips, tongue, or throat Low calcium level--muscle pain or cramps, confusion, tingling, or numbness in the hands or feet Osteonecrosis of the jaw--pain, swelling, or redness in the mouth, numbness of the jaw, poor healing after dental work, unusual discharge from the mouth, visible bones in the mouth Pain or trouble swallowing Severe bone, joint, or muscle pain Stomach bleeding--bloody or black, tar-like stools, vomiting blood or brown material that looks like coffee grounds Side effects that usually do not require medical attention (report to your care team if they continue or are bothersome): Constipation Diarrhea Nausea Stomach pain This list may not describe all possible side effects. Call your doctor for medical advice about side effects. You may report side effects to FDA at 1-800-FDA-1088. Where should I keep my medication? Keep out of the reach of children and pets. Store at room temperature between 15 and 30 degrees C (59 and 86 degrees F). Throw away any unused medication after the expiration date. NOTE: This sheet is a summary. It may not cover all possible information. If you have questions about this medicine, talk to your doctor, pharmacist, or health care provider.  2024 Elsevier/Gold Standard (2020-11-30 00:00:00)

## 2024-11-18 ENCOUNTER — Ambulatory Visit: Admitting: Obstetrics and Gynecology

## 2025-03-30 ENCOUNTER — Ambulatory Visit: Admitting: Internal Medicine

## 2025-10-11 ENCOUNTER — Encounter: Admitting: Obstetrics and Gynecology
